# Patient Record
Sex: Female | Born: 1955
Health system: Southern US, Community
[De-identification: ages and names within clinical notes are randomized; demographics above are authoritative.]

---

## 2003-09-07 IMAGING — CT CT ABDOMEN W/O CM
1 series · 15 of 32 positions shown, 19 images · IV contrast (agent unspecified)
Comparison: none

CLINICAL DATA: Left inguinal pain and hematuria. 
 CT ABDOMEN AND PELVIS WITH CONTRAST ([DATE] HOURS)
TECHNIQUE: 5 mm spiral images were obtained through the abdomen and pelvis without contrast. 
 CT ABDOMEN

[Series 2: renal stone · axial · 0.70mm/px · z∈[-439,-74]mm · 15 of 82 slices shown, 19 images]
[im 6/82  soft-tissue]
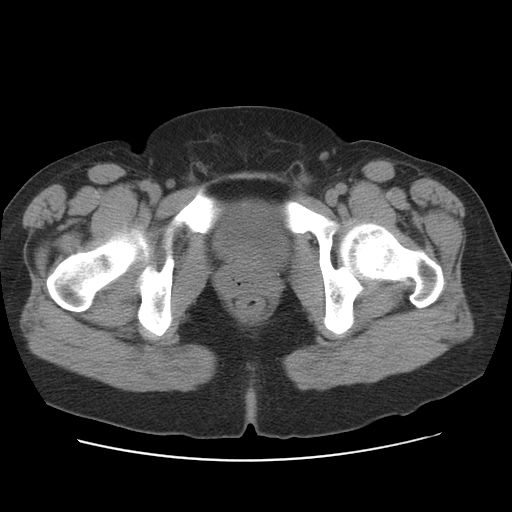
[im 6/82  bone]
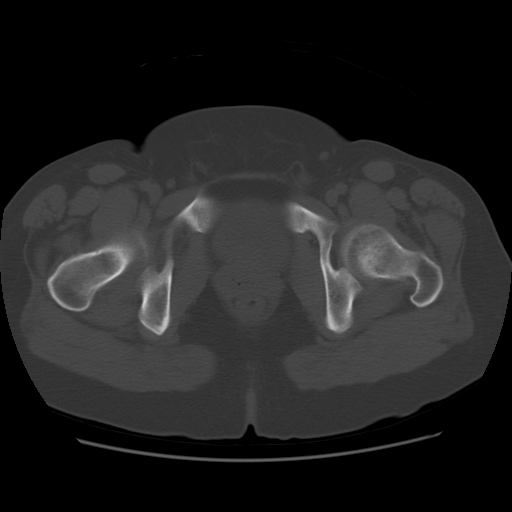
[im 11/82  soft-tissue]
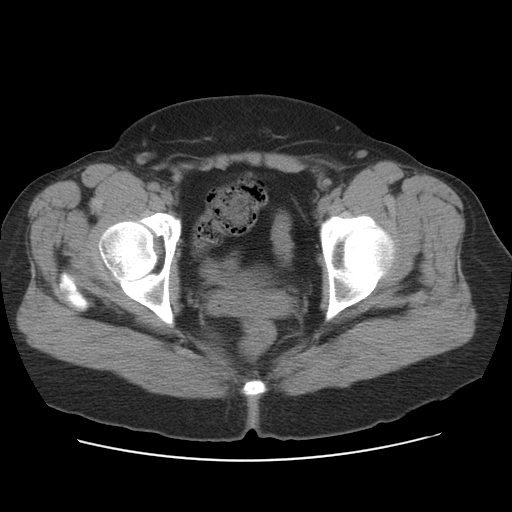
[im 16/82  soft-tissue]
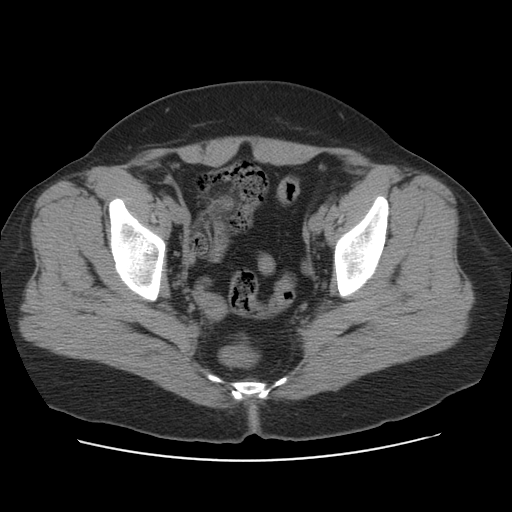
[im 24/82  soft-tissue]
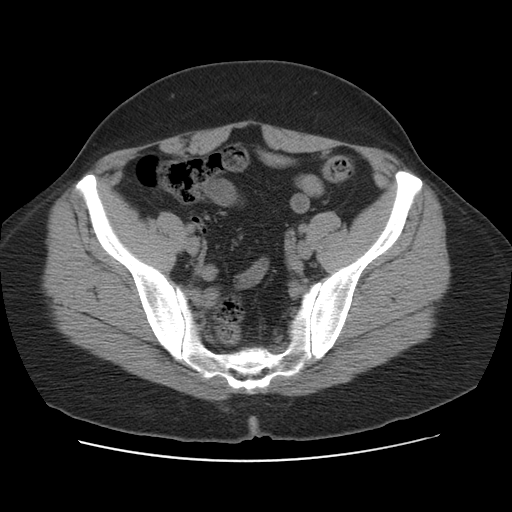
[im 29/82  soft-tissue]
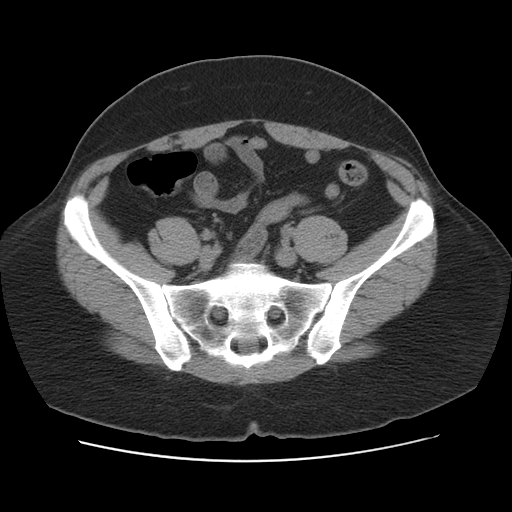
[im 34/82  soft-tissue]
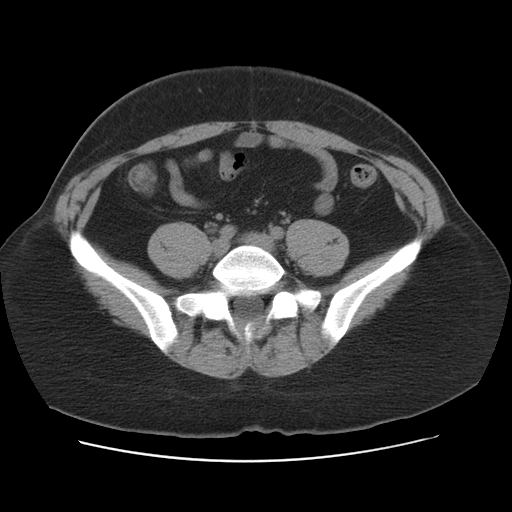
[im 42/82  soft-tissue]
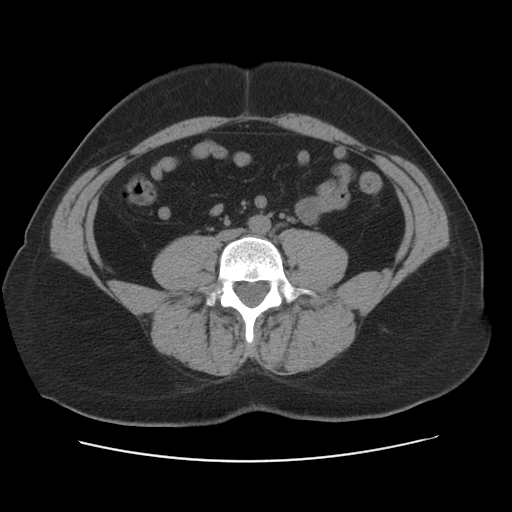
[im 48/82  soft-tissue]
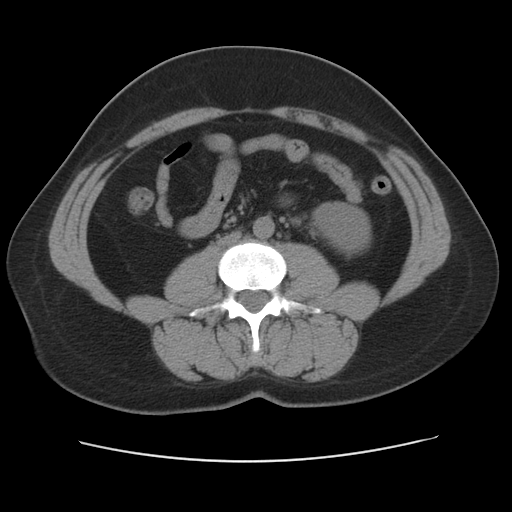
[im 53/82  soft-tissue]
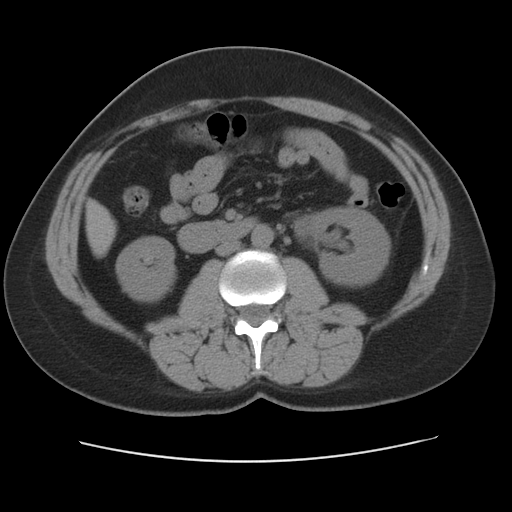
[im 53/82  bone]
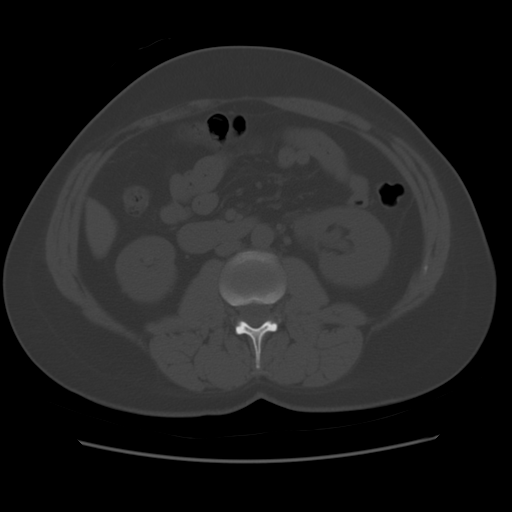
[im 58/82  soft-tissue]
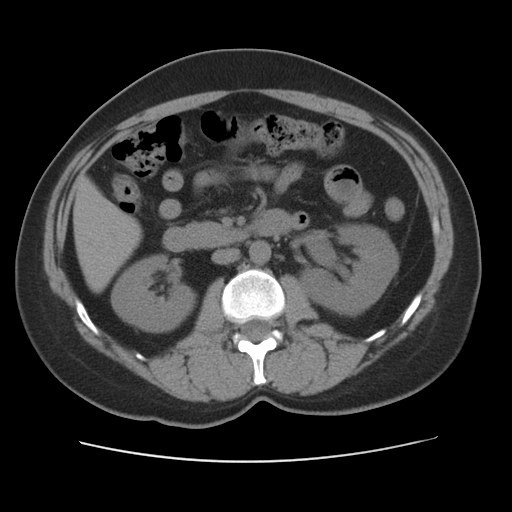
[im 66/82  soft-tissue]
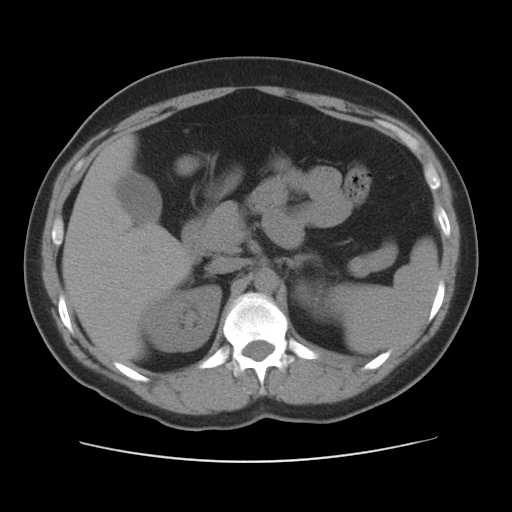
[im 71/82  soft-tissue]
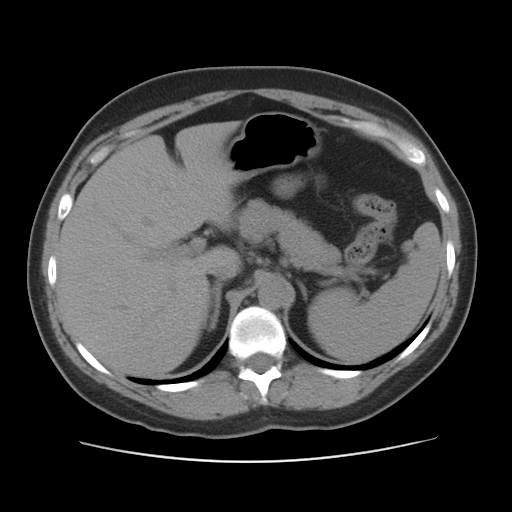
[im 71/82  lung]
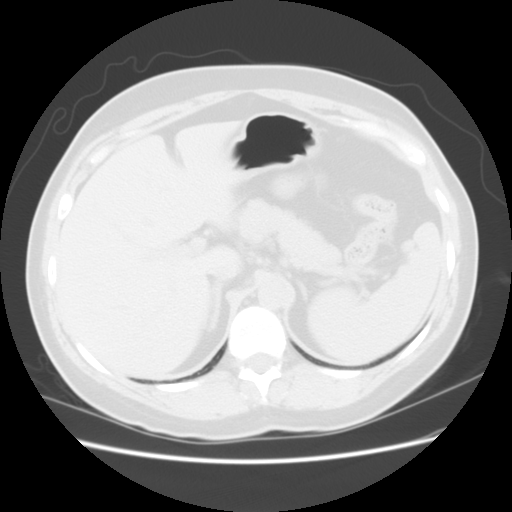
[im 74/82  lung]
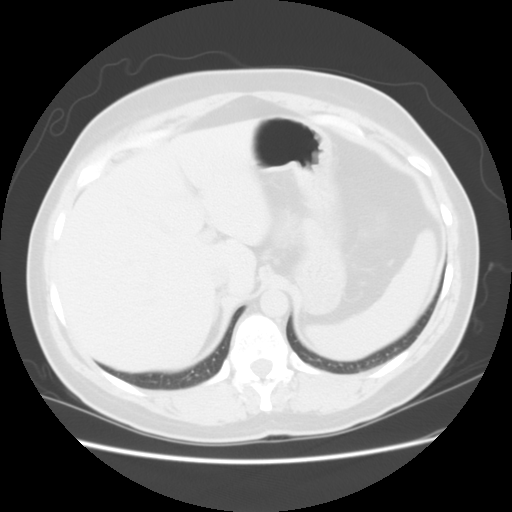
[im 76/82  soft-tissue]
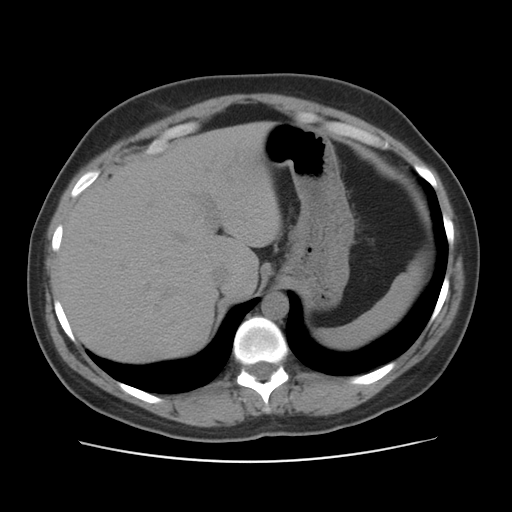
[im 76/82  lung]
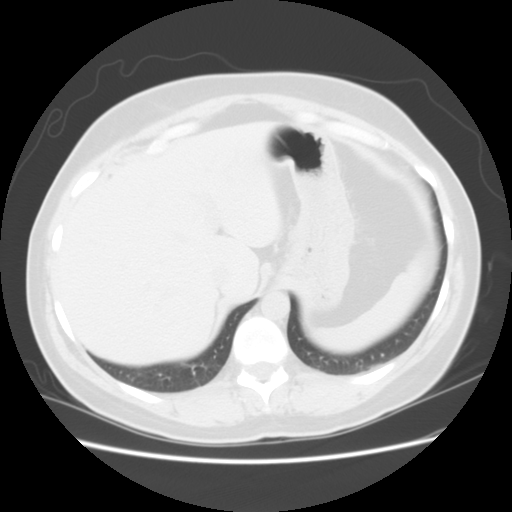
[im 79/82  lung]
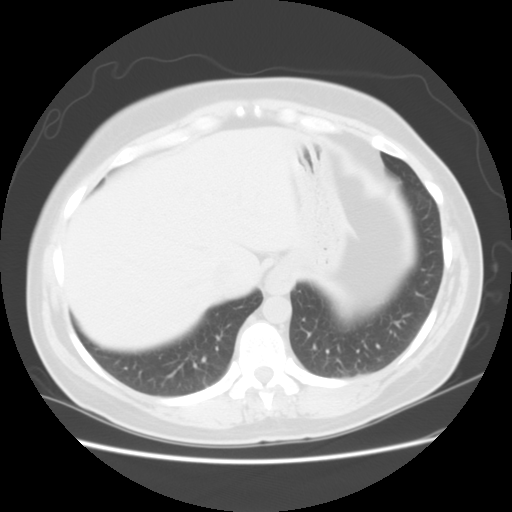

[15 of 32 positions shown; findings below may reference images not displayed]

FINDINGS: Left hydronephrosis and mild left perinephric stranding is noted.  Calculi are seen in the inferior aspect of the left kidney on image 32.  There is also a 6 mm calculus at the left ureteropelvic junction.  Stranding is seen about the proximal ureter.  These findings are compatible with urethral obstruction.  The right kidney is within normal limits.
 The liver, gallbladder, spleen, adrenal glands, and pancreas are within normal limits.  Negative for fluid. 
 IMPRESSION
 Nephrolithiasis.  Left ureteropelvic junction calculus.  Left hydronephrosis and perinephric stranding compatible with urinary obstruction. 
 CT PELVIS 
 There is a small calcification in the left side of the pelvis on image 63.  There is no surrounding soft tissue density and this is felt to be a vascular calcification.  The bladder and right ureter are within normal limits.  The appendix is normal.  Negative free fluid.  
 IMPRESSION
 No evidence of acute intrapelvic pathology.

## 2003-09-12 IMAGING — CR DG ABDOMEN 1V
1 series · 1 of 1 positions shown · non-contrast
Comparison: CT abdomen and pelvis [DATE].

CLINICAL DATA: Left ureteral calculus.  Preop.
 ABDOMEN ? AP VIEW [DATE]

[view not recorded]
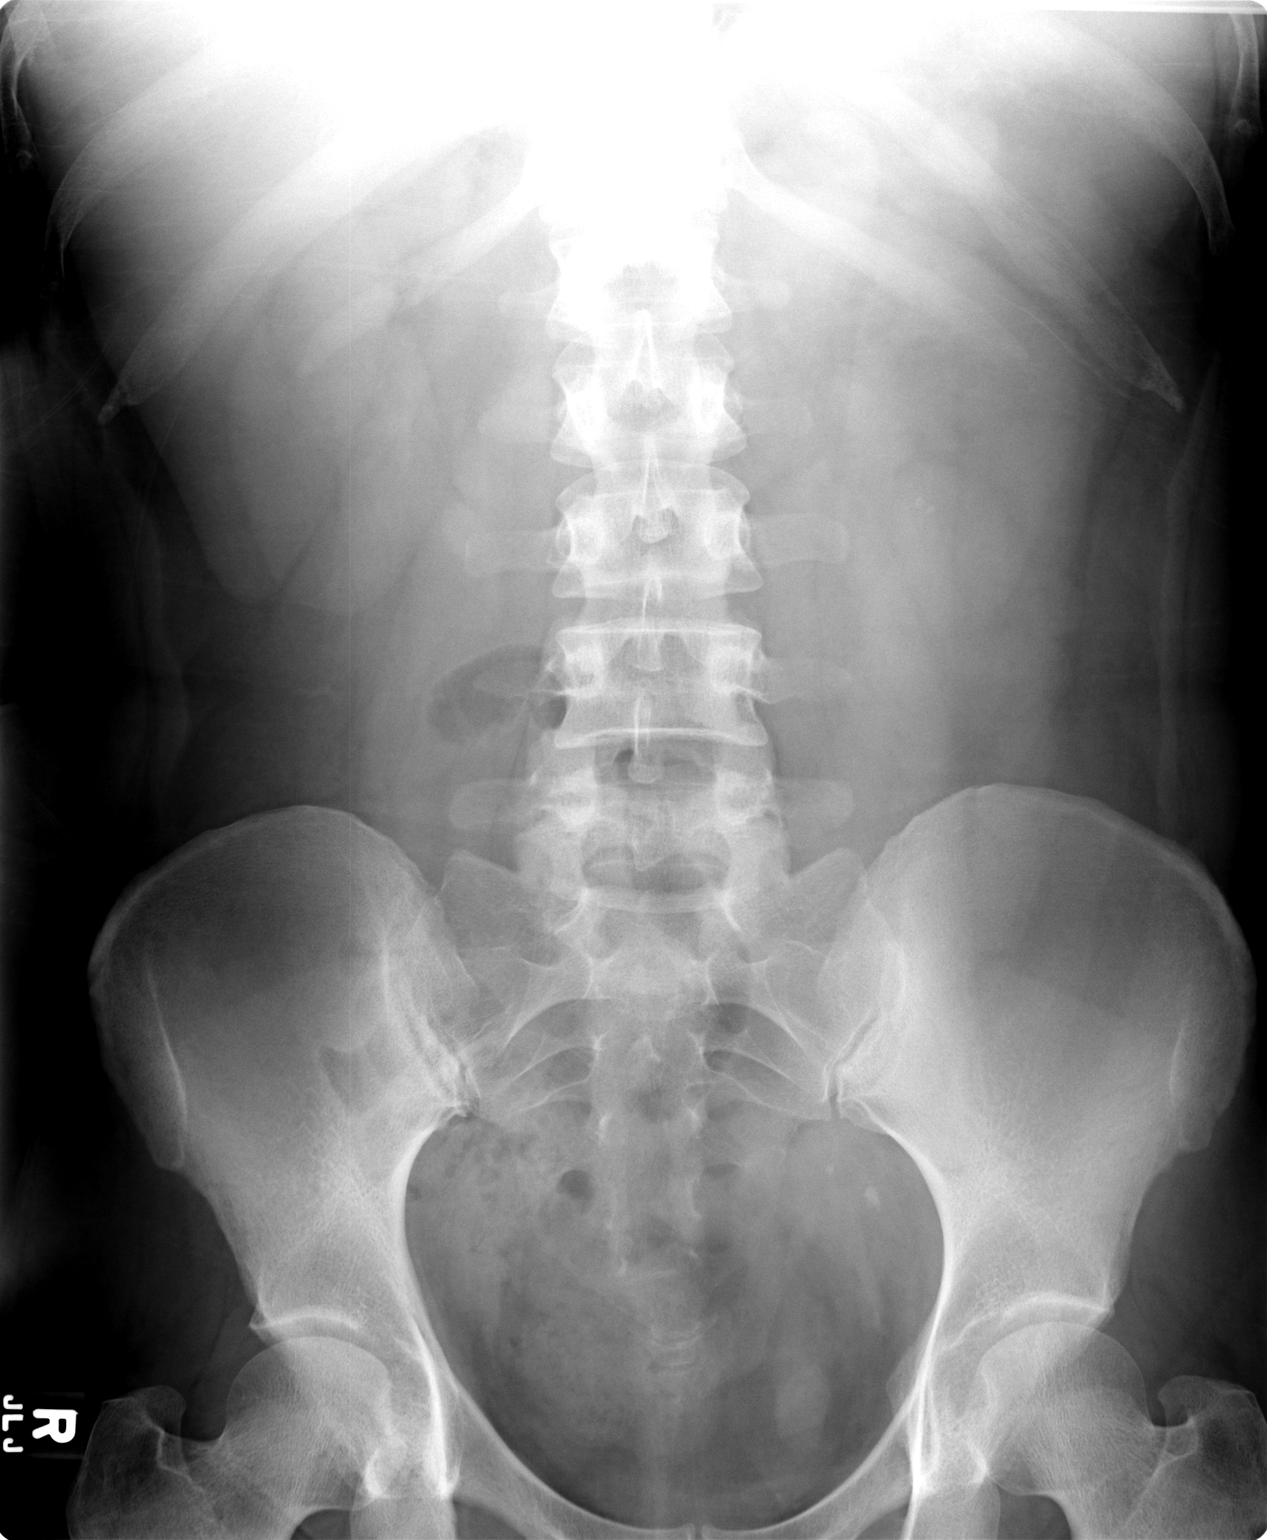

[1 of 1 positions shown; findings below may reference images not displayed]

The two small stones in a lower pole calix of the left kidney are again noted.  The calculus at the left UPJ noted on that examination is not well-visualized.  Vascular calcification in the left side of the pelvis is noted.  There is also a second tiny calcification which is too small to be the ureteral calculus noted on the CT.  The bowel gas pattern is unremarkable.  
 IMPRESSION
 Left lower pole renal calculi.  The left UPJ calculus identified on the prior CT examination is not well seen.

## 2007-12-14 IMAGING — CR DG CHEST 2V
2 series · 2 of 2 positions shown · non-contrast
Comparison: None

CLINICAL DATA: Cough.  Smoker.

CHEST - 2 VIEW

[view not recorded (1 of 2)]
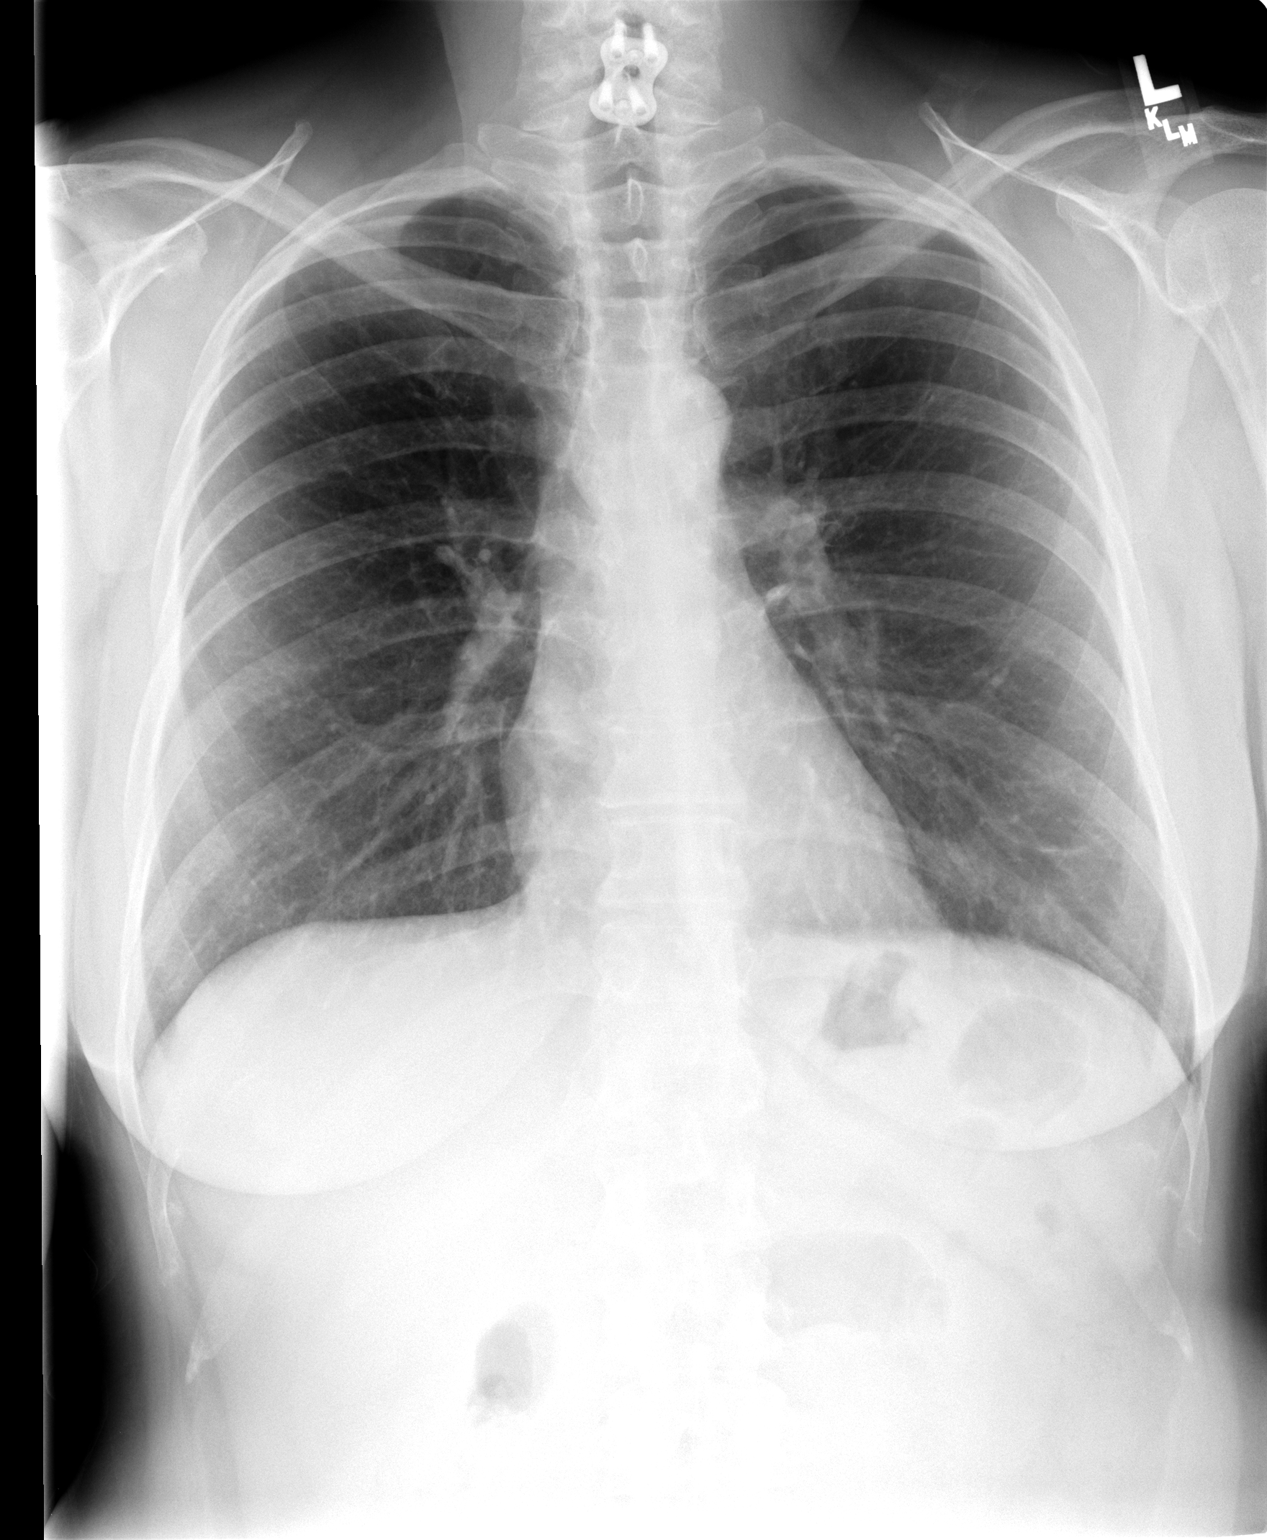

[view not recorded (2 of 2)]
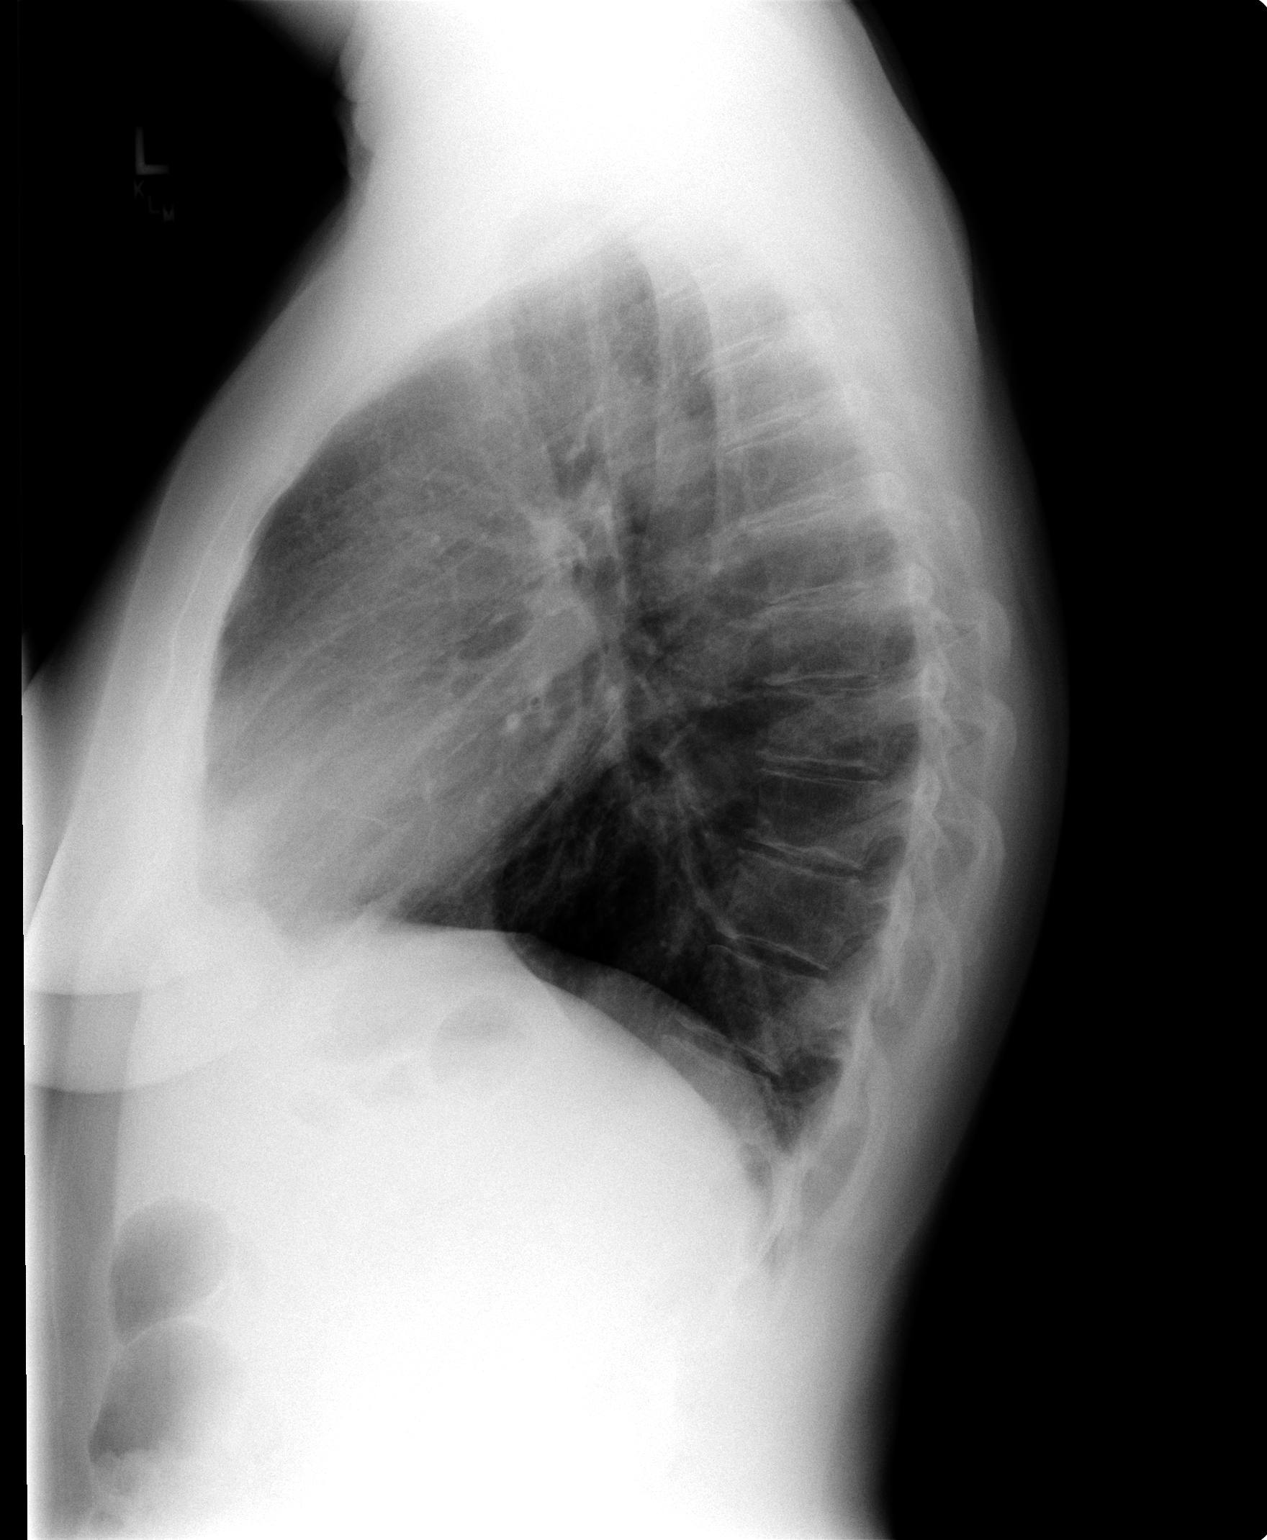

[2 of 2 positions shown; findings below may reference images not displayed]

FINDINGS: Trachea is midline.  Heart size normal.  Scarring is seen
in the lingula.  Lungs otherwise clear.  No pleural fluid.
IMPRESSION: No acute findings.
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
## 2012-11-29 IMAGING — CR DG CHEST 2V
2 series · 2 of 2 positions shown · non-contrast
Comparison: [DATE]

CLINICAL DATA: Shortness of breath.  Tobacco use.  Cough.

CHEST - 2 VIEW

[PA]
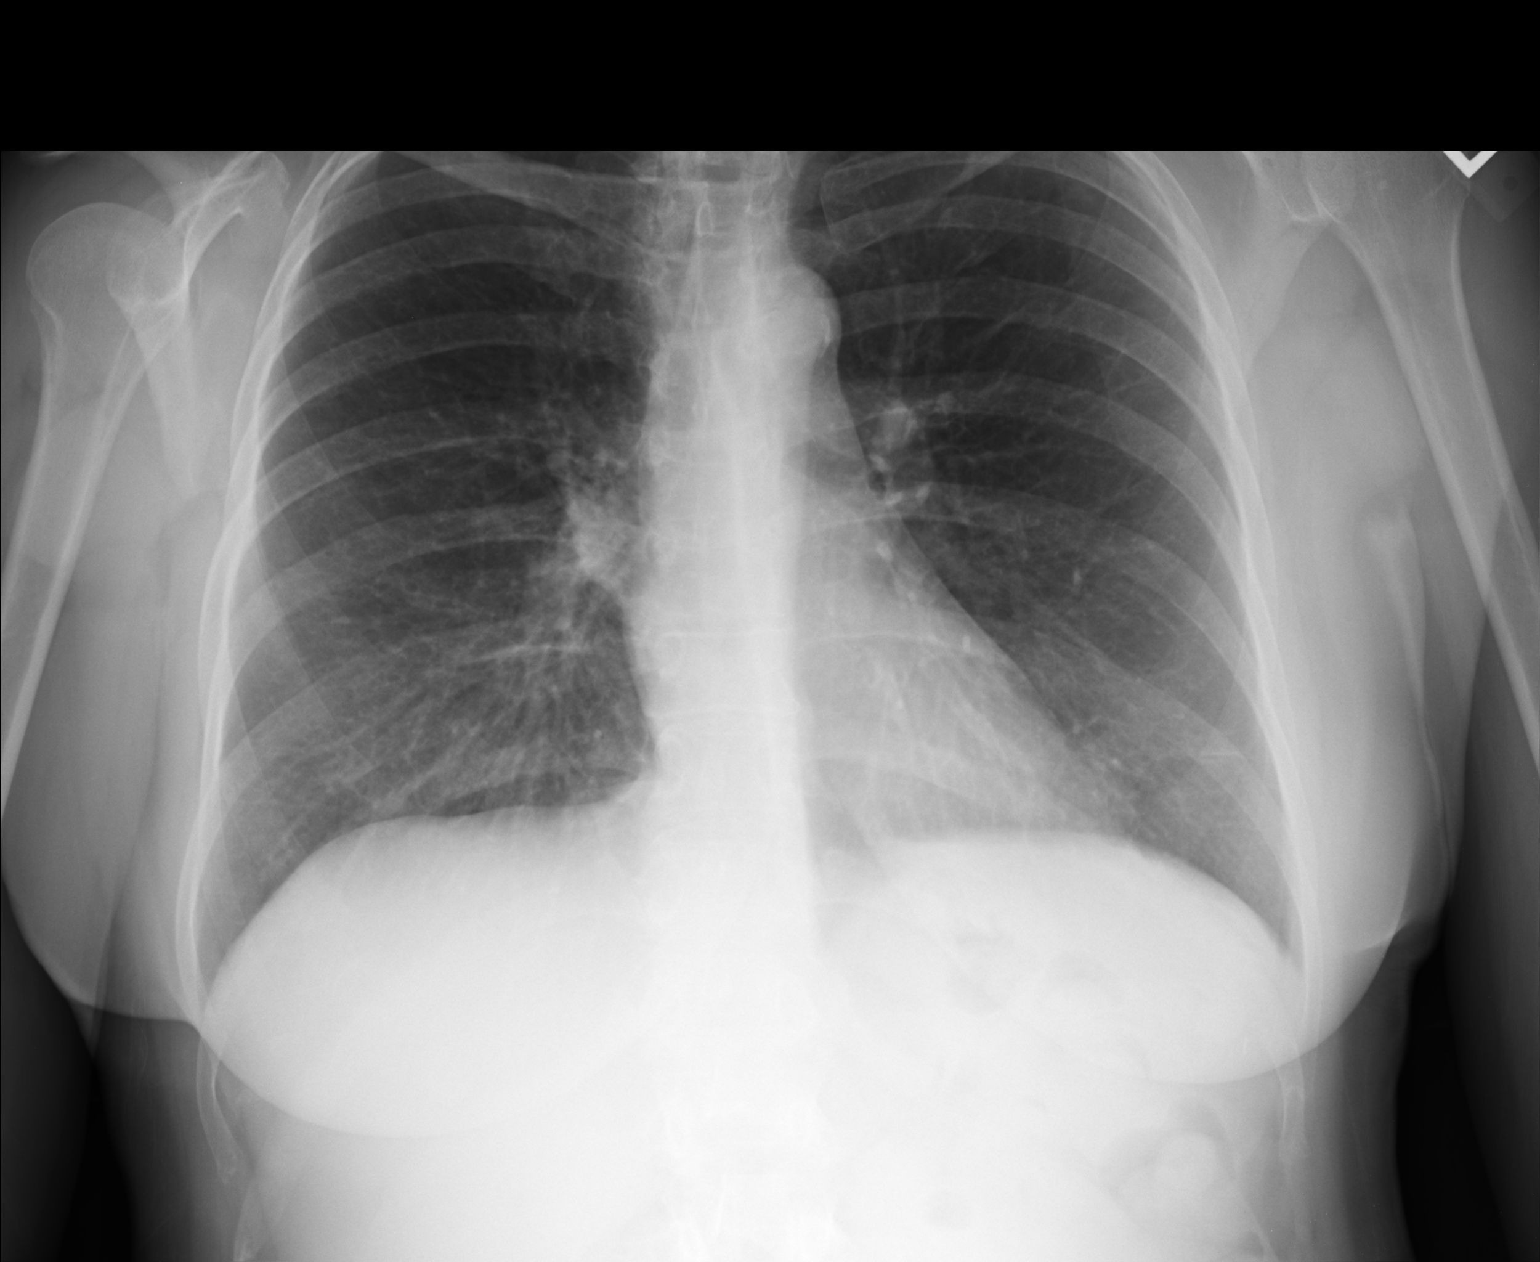

[lateral]
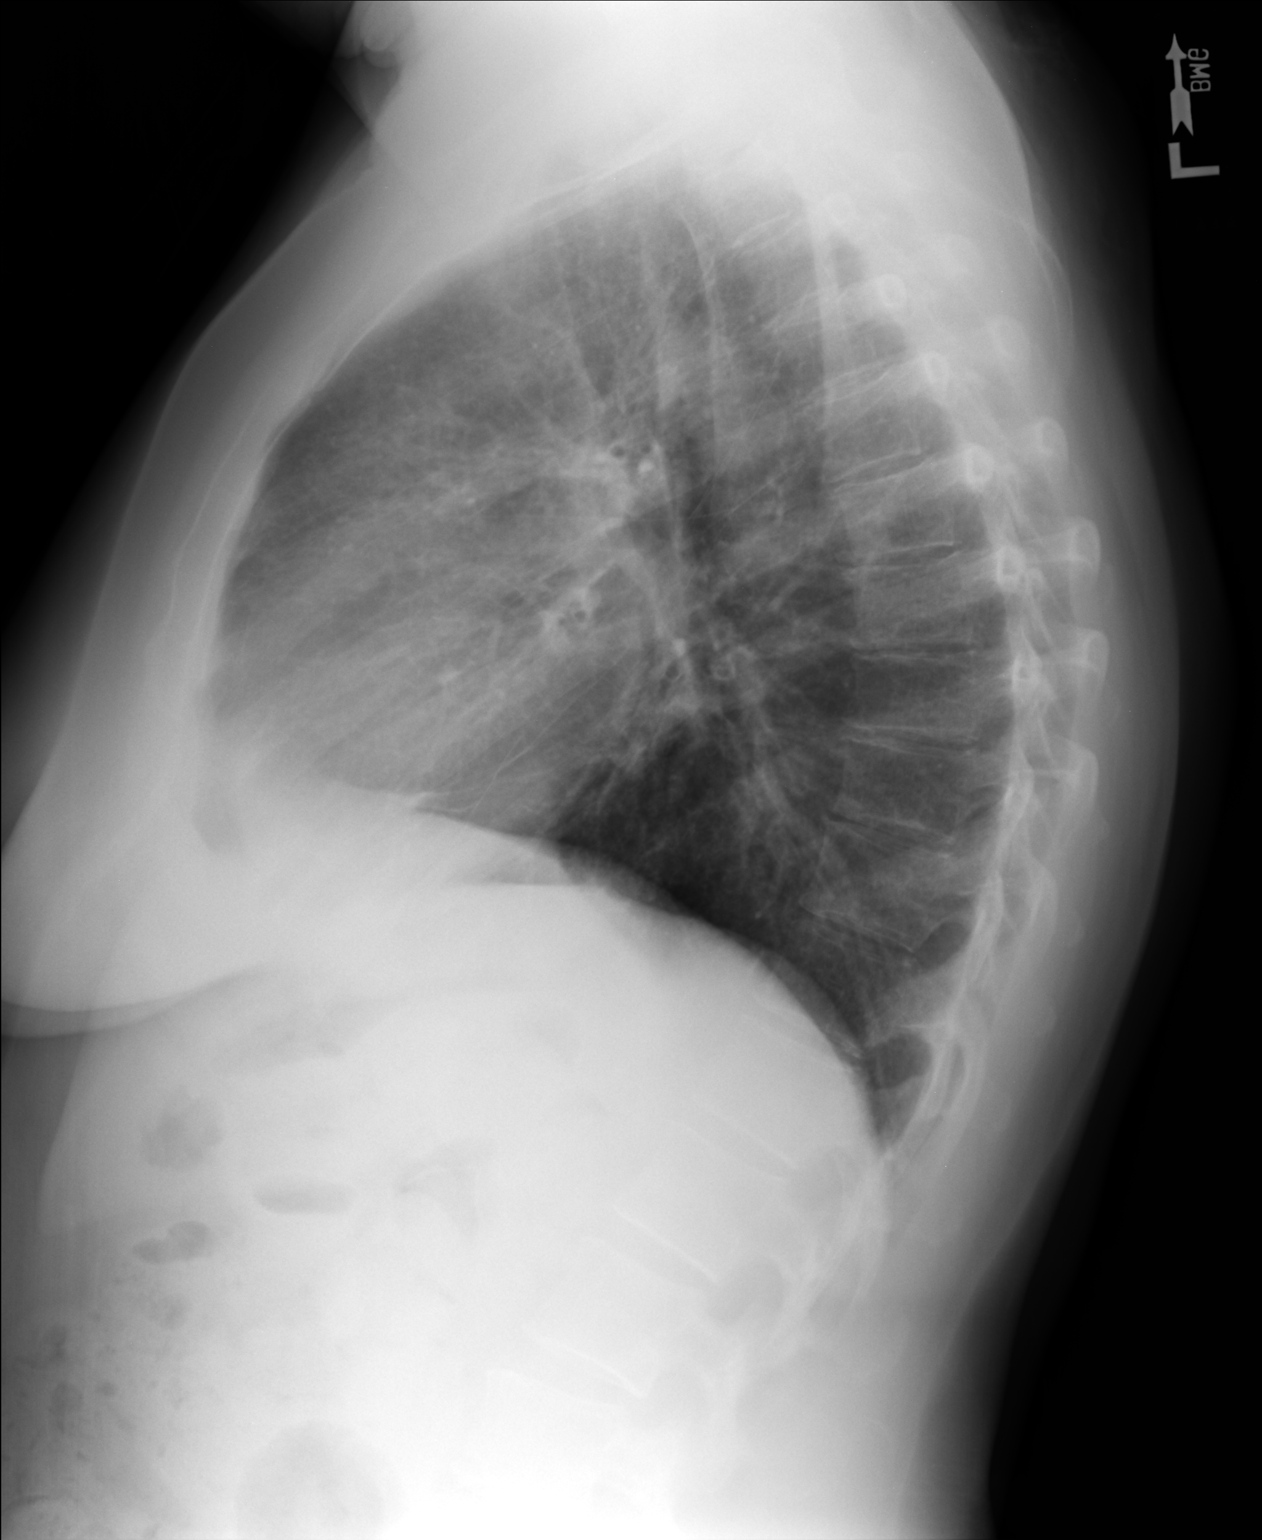

[2 of 2 positions shown; findings below may reference images not displayed]

FINDINGS: Atherosclerotic aortic arch.

Airway thickening may reflect bronchitis or reactive airways
disease.  No airspace opacity is identified to suggest bacterial
pneumonia pattern.  Heart size within normal limits.  No pleural
effusion.
IMPRESSION: 1.  Mild central airway thickening suspicious for bronchitis or
reactive airways disease.  Otherwise negative.

## 2019-07-12 IMAGING — CT CT ANGIO NECK
1 of 9 series · 5 of 33 positions shown · IV contrast (OMNI 350)
Comparison: CT head earlier today.

CLINICAL DATA: Stroke.  Aphasia

EXAM:
CT ANGIOGRAPHY HEAD AND NECK
TECHNIQUE: Multidetector CT imaging of the head and neck was performed using
the standard protocol during bolus administration of intravenous
contrast. Multiplanar CT image reconstructions and MIPs were
obtained to evaluate the vascular anatomy. Carotid stenosis
measurements (when applicable) are obtained utilizing NASCET
criteria, using the distal internal carotid diameter as the
denominator.
CONTRAST:  75mL OMNIPAQUE IOHEXOL 350 MG/ML SOLN

[Series 4: cta neck thins · axial · 0.49mm/px · z∈[-258,-18]mm · 5 of 899 slices shown]
[im 150/899  soft-tissue]
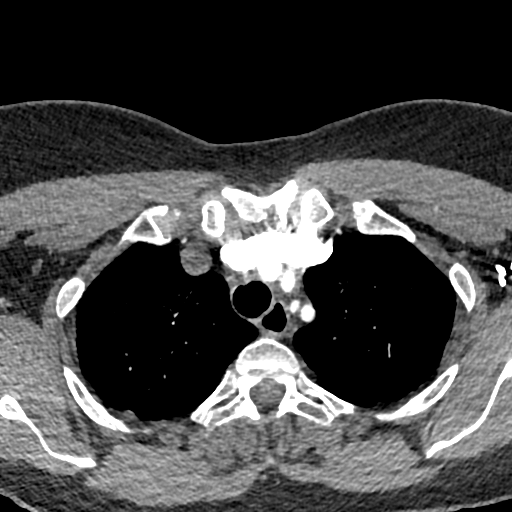
[im 300/899  bone]
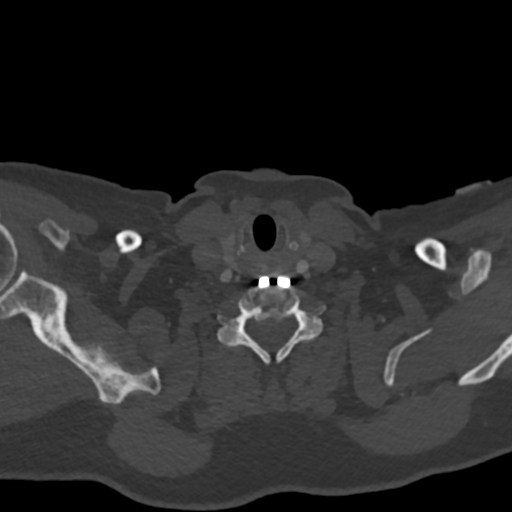
[im 450/899  soft-tissue]
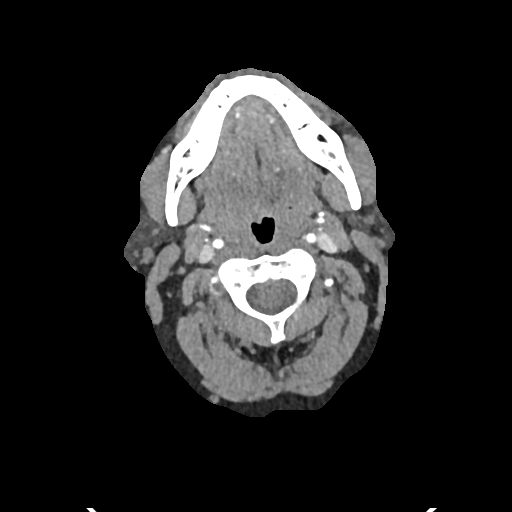
[im 599/899  bone]
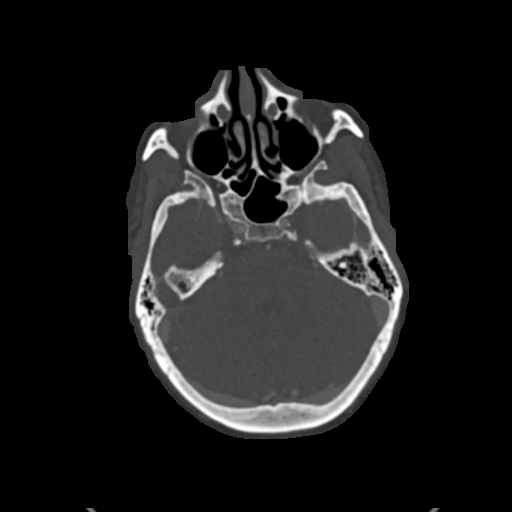
[im 749/899  soft-tissue]
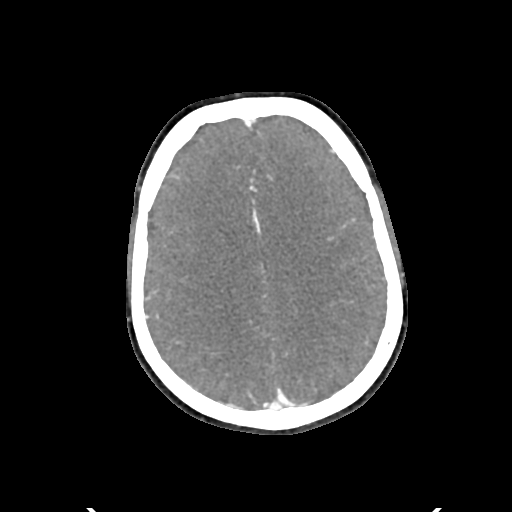

[5 of 33 positions shown; findings below may reference images not displayed]

FINDINGS: CTA NECK FINDINGS

Aortic arch: Mild atherosclerotic disease aortic arch. 4 vessel arch
with the left vertebral artery origin from the arch.

Right carotid system: Atherosclerotic calcification right carotid
bifurcation. 65% diameter stenosis proximal right internal carotid
artery. Right external carotid artery mildly stenotic at the origin.
Remainder of the right internal carotid artery widely patent to the
skull base

Left carotid system: Mild atherosclerotic calcification left carotid
bifurcation without significant stenosis.

Vertebral arteries: Left vertebral artery origin from the arch. Both
vertebral arteries patent to the basilar. Right vertebral artery
widely patent. Moderate stenosis distal left vertebral artery at the
skull base due to calcific stenosis.

Skeleton: ACDF C5-6 with corpectomy.  No acute skeletal abnormality.

Other neck: Negative for mass or adenopathy in the neck.

Upper chest: Lung apices clear bilaterally.

Review of the MIP images confirms the above findings

CTA HEAD FINDINGS

Anterior circulation: Mild atherosclerotic calcification in the
cavernous carotid bilaterally without significant stenosis. Anterior
and middle cerebral arteries patent without significant stenosis.
Mild stenosis right M1.

Posterior circulation: Moderate stenosis left vertebral artery at
the skull base due to calcific disease. Right vertebral widely
patent to the basilar. Small right PICA. Left PICA appears supplied
from the AICA which is patent bilaterally. Mild stenosis in the mid
basilar. Superior cerebellar arteries patent bilaterally. Fetal
origin of the posterior cerebral artery. Mild atherosclerotic
disease bilaterally.

Venous sinuses: Normal venous enhancement.

Anatomic variants: None

Review of the MIP images confirms the above findings
IMPRESSION: 1. Negative for emergent large vessel occlusion.
2. Mild atherosclerotic disease in the right M1 segment and in both
posterior cerebral arteries. Mild atherosclerotic disease in the
basilar.
3. 65% diameter stenosis proximal right internal carotid artery.
Left internal carotid artery widely patent
4. Moderate calcific stenosis  distal left vertebral artery.
5. These results were called by telephone at the time of
interpretation on [DATE] at [DATE] to provider DARTANJAN, who
verbally acknowledged these results.

## 2019-07-12 IMAGING — CT CT HEAD CODE STROKE
3 series · 15 of 47 positions shown, 18 images · non-contrast
Comparison: CT head [DATE]

CLINICAL DATA: Code stroke. Aphasia with right arm weakness. Last
seen normal [Y5] hours

EXAM:
CT HEAD WITHOUT CONTRAST
TECHNIQUE: Contiguous axial images were obtained from the base of the skull
through the vertex without intravenous contrast.

[Series 3: head 5.0 st · axial · 0.44mm/px · z∈[-86,+44]mm · 9 of 32 slices shown, 12 images]
[im 3/32  brain]
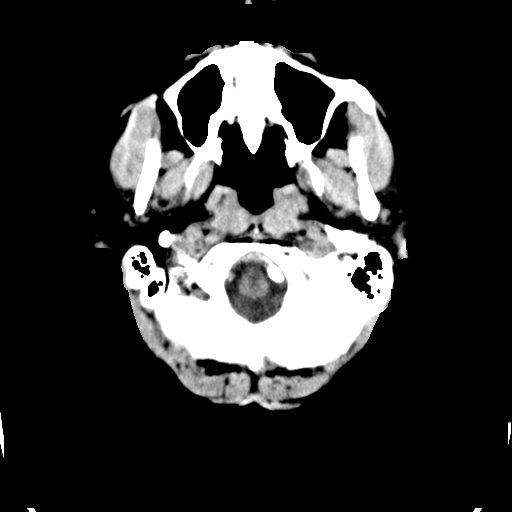
[im 3/32  bone]
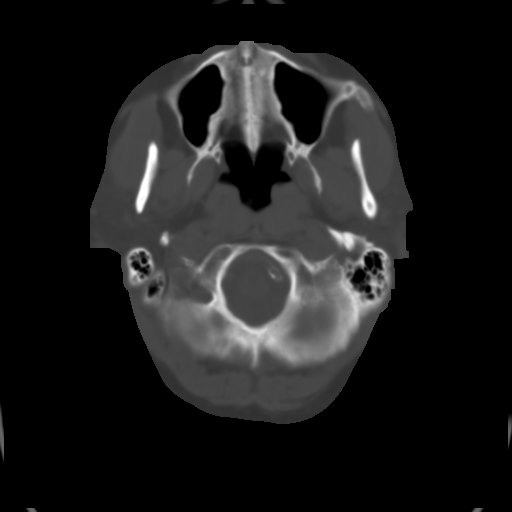
[im 6/32  brain]
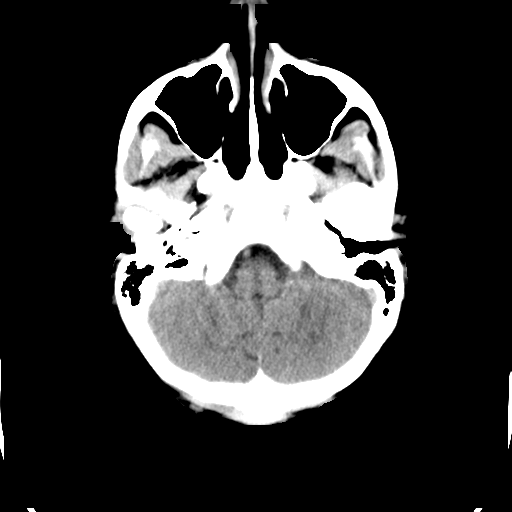
[im 9/32  brain]
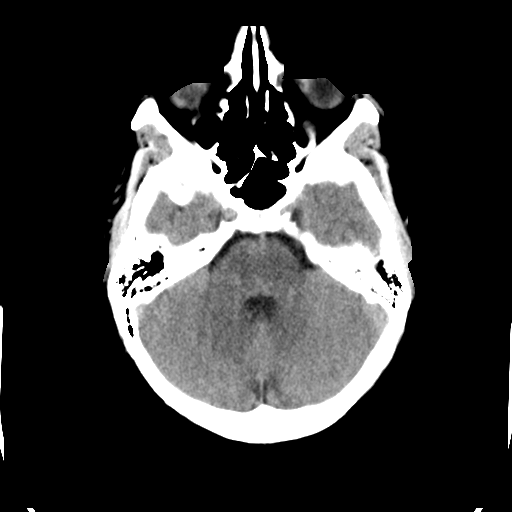
[im 12/32  brain]
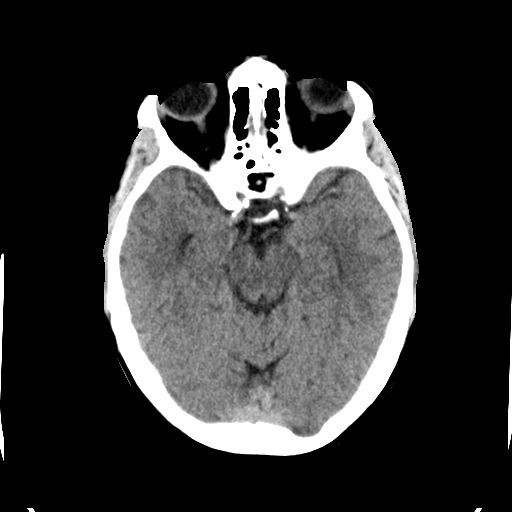
[im 17/32  brain]
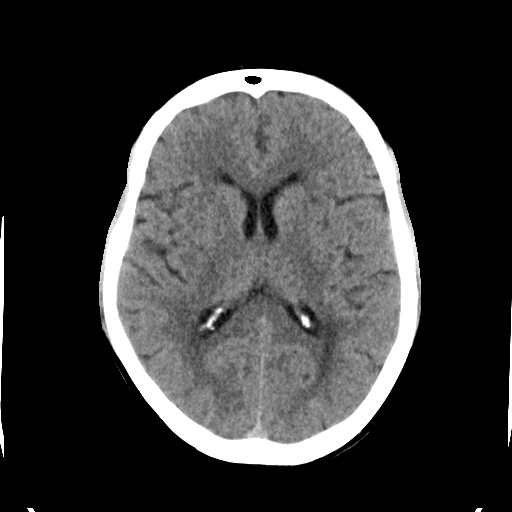
[im 17/32  bone]
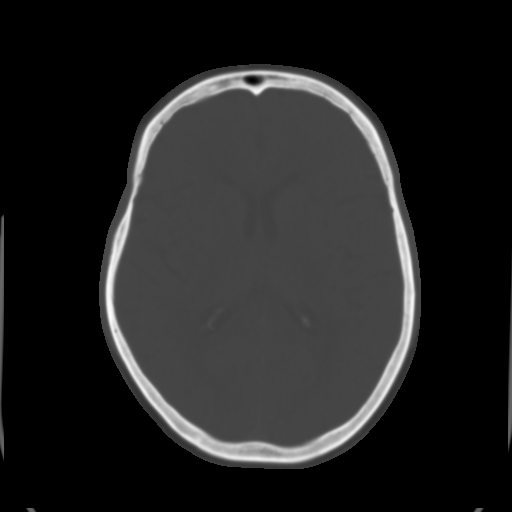
[im 20/32  brain]
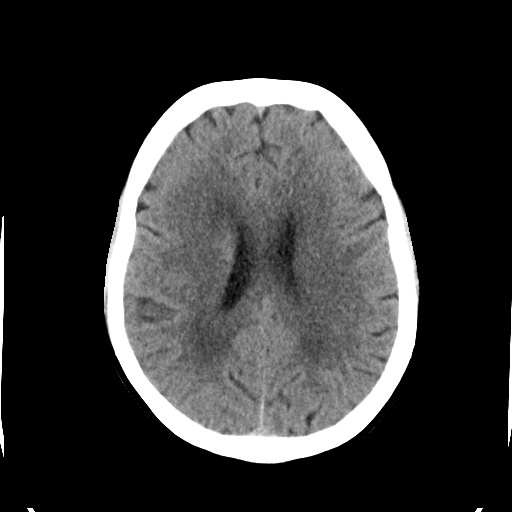
[im 23/32  brain]
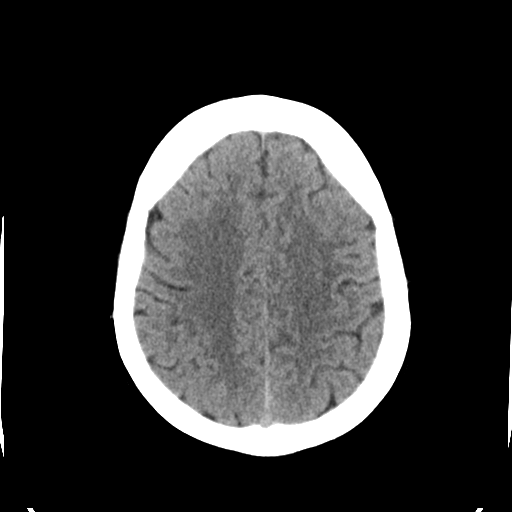
[im 26/32  brain]
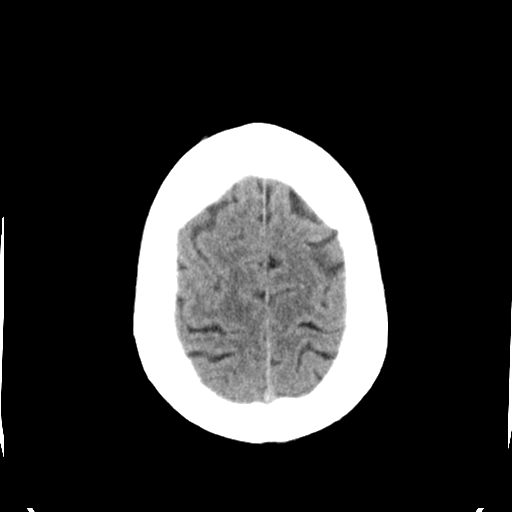
[im 29/32  brain]
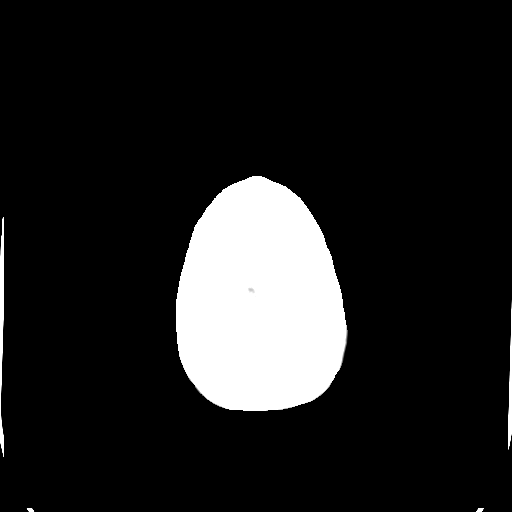
[im 29/32  bone]
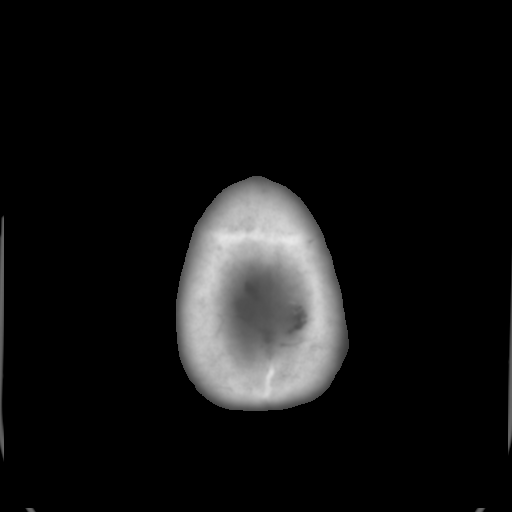

[Series 5: head 3.0 cor st · coronal · 0.31mm/px · 3 of 67 slices shown]
[im 23/67  brain]
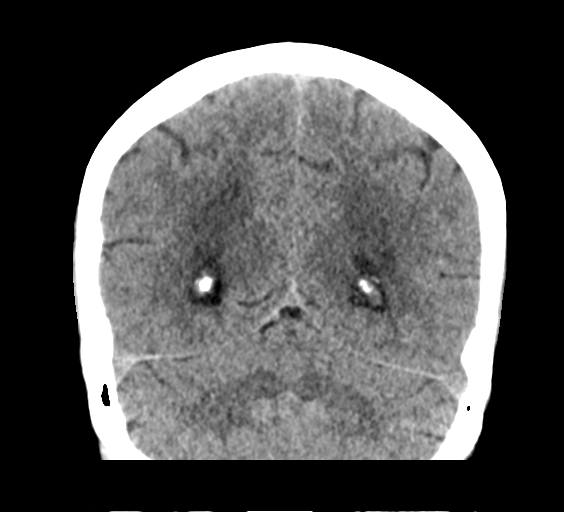
[im 30/67  brain]
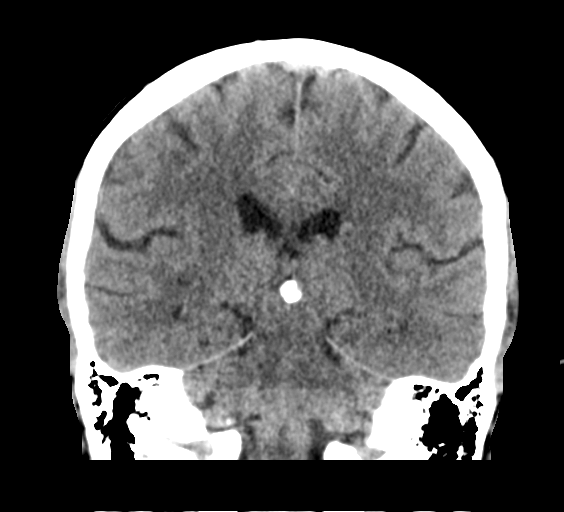
[im 37/67  brain]
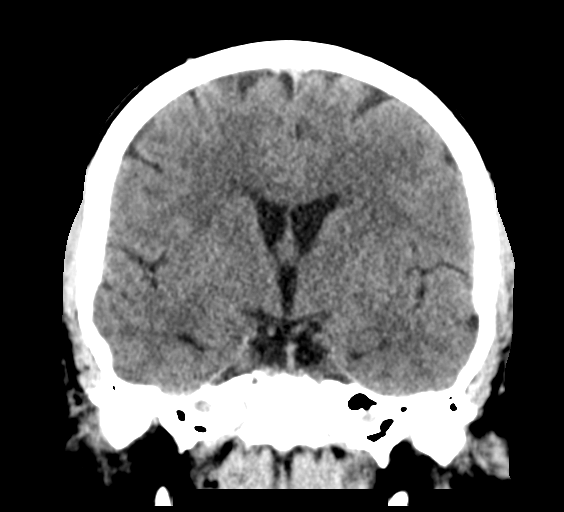

[Series 6: head 3.0 sag st · sagittal · 0.31mm/px · 3 of 61 slices shown]
[im 21/61  brain]
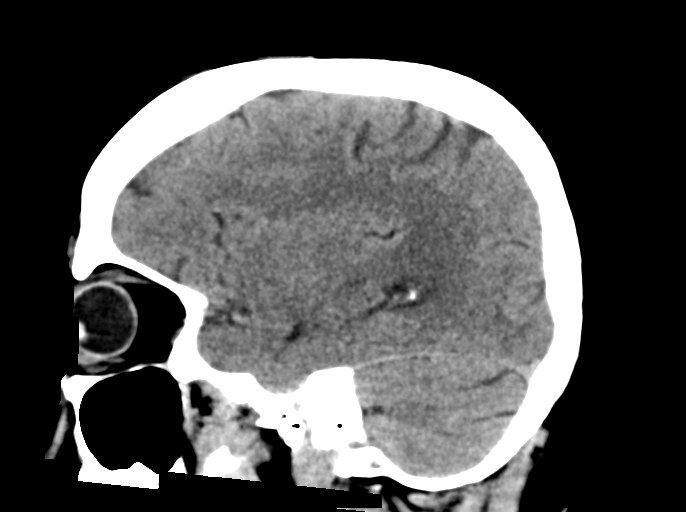
[im 31/61  brain]
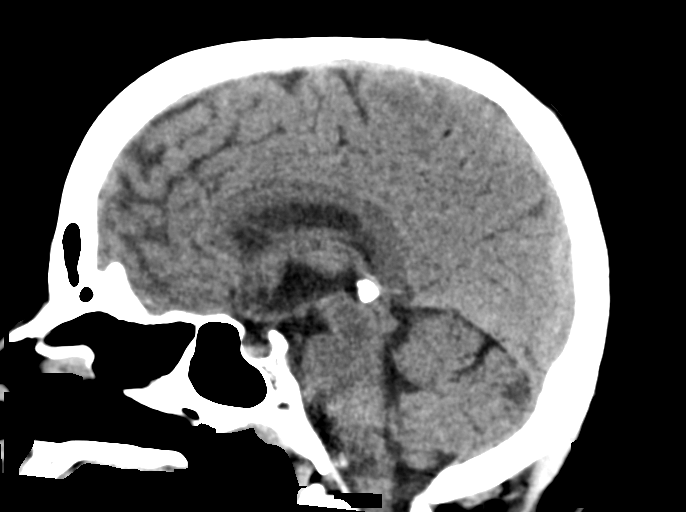
[im 41/61  brain]
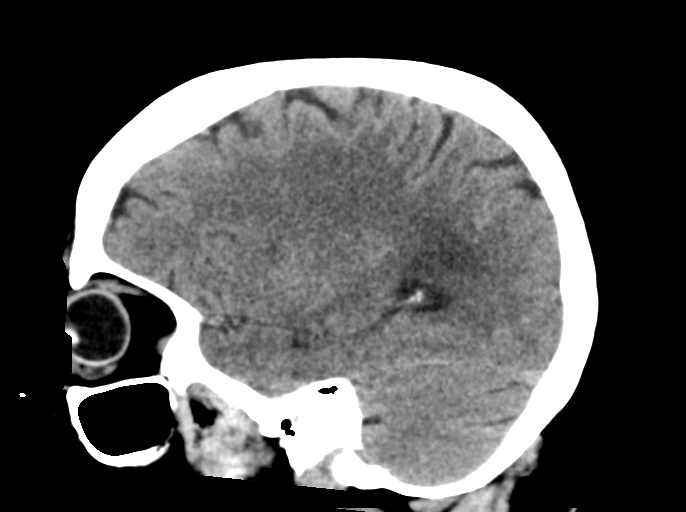

[15 of 47 positions shown; findings below may reference images not displayed]

FINDINGS: Brain: Ventricle size normal. Negative for acute hemorrhage. Patchy
white matter hypodensity bilaterally is relatively symmetric and
appears chronic although is new since [Y5].

Negative for acute infarct, hemorrhage, mass.

Vascular: Atherosclerotic calcification in the carotid and vertebral
arteries. Negative for hyperdense vessel

Skull: Negative

Sinuses/Orbits: Negative

Other: None

ASPECTS (Alberta Stroke Program Early CT Score)

- Ganglionic level infarction (caudate, lentiform nuclei, internal
capsule, insula, M1-M3 cortex): 7

- Supraganglionic infarction (M4-M6 cortex): 3

Total score (0-10 with 10 being normal): 10
IMPRESSION: 1. No acute intracranial abnormality
2. ASPECTS is 10
3. Patchy white matter hypodensity bilaterally most consistent with
chronic microvascular ischemia.
4. These results were called by telephone at the time of
interpretation on [DATE] at [DATE] to provider GARLENS, who
verbally acknowledged these results.

## 2019-07-12 IMAGING — CT CT ANGIO NECK
1 series · 1 of 1 positions shown · IV contrast (omnipaque)
Comparison: CT head earlier today.

CLINICAL DATA: Stroke.  Aphasia

EXAM:
CT ANGIOGRAPHY HEAD AND NECK
TECHNIQUE: Multidetector CT imaging of the head and neck was performed using
the standard protocol during bolus administration of intravenous
contrast. Multiplanar CT image reconstructions and MIPs were
obtained to evaluate the vascular anatomy. Carotid stenosis
measurements (when applicable) are obtained utilizing NASCET
criteria, using the distal internal carotid diameter as the
denominator.
CONTRAST:  75mL OMNIPAQUE IOHEXOL 350 MG/ML SOLN

[Series 2: topogram 0.6 t20f · coronal · 1.00mm/px · 1 of 1 slices shown]
[im 1/1]
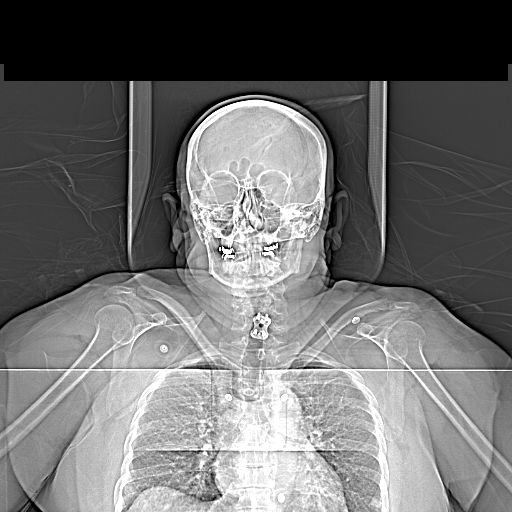

[1 of 1 positions shown; findings below may reference images not displayed]

FINDINGS: CTA NECK FINDINGS

Aortic arch: Mild atherosclerotic disease aortic arch. 4 vessel arch
with the left vertebral artery origin from the arch.

Right carotid system: Atherosclerotic calcification right carotid
bifurcation. 65% diameter stenosis proximal right internal carotid
artery. Right external carotid artery mildly stenotic at the origin.
Remainder of the right internal carotid artery widely patent to the
skull base

Left carotid system: Mild atherosclerotic calcification left carotid
bifurcation without significant stenosis.

Vertebral arteries: Left vertebral artery origin from the arch. Both
vertebral arteries patent to the basilar. Right vertebral artery
widely patent. Moderate stenosis distal left vertebral artery at the
skull base due to calcific stenosis.

Skeleton: ACDF C5-6 with corpectomy.  No acute skeletal abnormality.

Other neck: Negative for mass or adenopathy in the neck.

Upper chest: Lung apices clear bilaterally.

Review of the MIP images confirms the above findings

CTA HEAD FINDINGS

Anterior circulation: Mild atherosclerotic calcification in the
cavernous carotid bilaterally without significant stenosis. Anterior
and middle cerebral arteries patent without significant stenosis.
Mild stenosis right M1.

Posterior circulation: Moderate stenosis left vertebral artery at
the skull base due to calcific disease. Right vertebral widely
patent to the basilar. Small right PICA. Left PICA appears supplied
from the AICA which is patent bilaterally. Mild stenosis in the mid
basilar. Superior cerebellar arteries patent bilaterally. Fetal
origin of the posterior cerebral artery. Mild atherosclerotic
disease bilaterally.

Venous sinuses: Normal venous enhancement.

Anatomic variants: None

Review of the MIP images confirms the above findings
IMPRESSION: 1. Negative for emergent large vessel occlusion.
2. Mild atherosclerotic disease in the right M1 segment and in both
posterior cerebral arteries. Mild atherosclerotic disease in the
basilar.
3. 65% diameter stenosis proximal right internal carotid artery.
Left internal carotid artery widely patent
4. Moderate calcific stenosis  distal left vertebral artery.
5. These results were called by telephone at the time of
interpretation on [DATE] at [DATE] to provider DARTANJAN, who
verbally acknowledged these results.

## 2019-07-12 IMAGING — CT CT HEAD W/O CM
4 series · 17 of 47 positions shown, 19 images · non-contrast
Comparison: CT head from same day.

CLINICAL DATA: Stroke follow-up. Right-sided facial droop and
slurred speech.

EXAM:
CT HEAD WITHOUT CONTRAST
TECHNIQUE: Contiguous axial images were obtained from the base of the skull
through the vertex without intravenous contrast.

[Series 3: head without · axial · non-contrast · 0.43mm/px · z∈[-95,+25]mm · 7 of 34 slices shown, 9 images]
[im 5/34  brain]
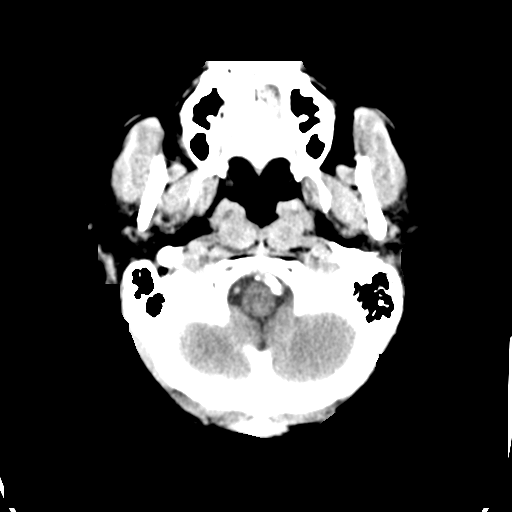
[im 5/34  bone]
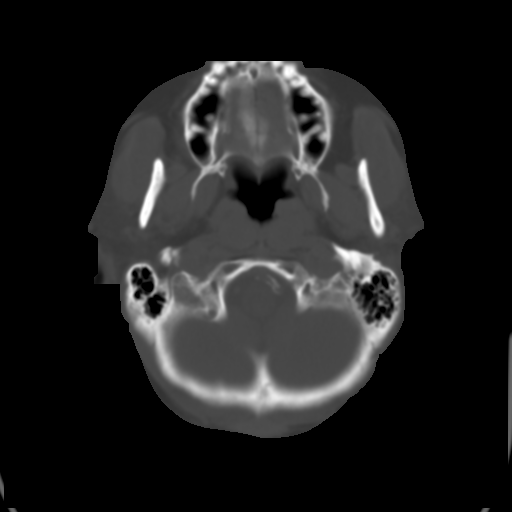
[im 9/34  brain]
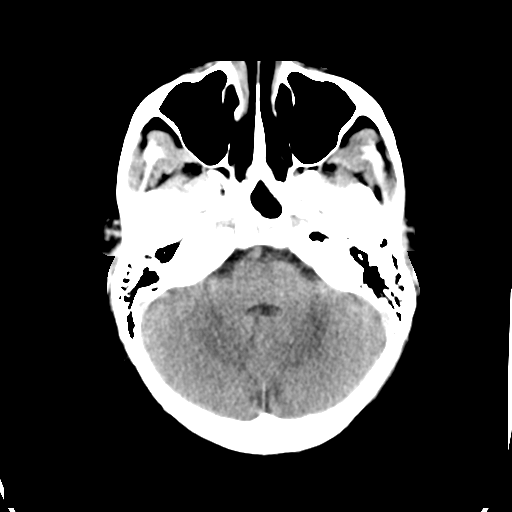
[im 13/34  brain]
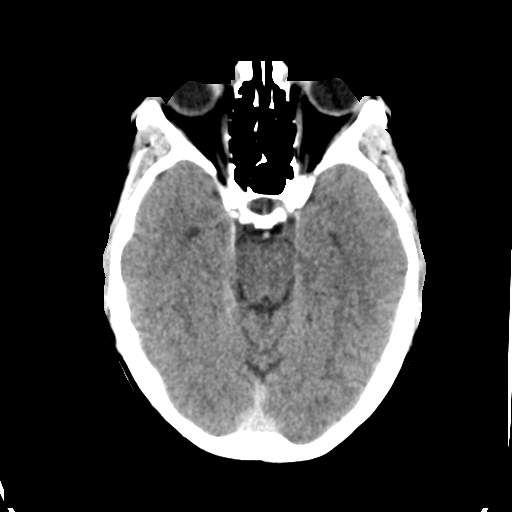
[im 17/34  brain]
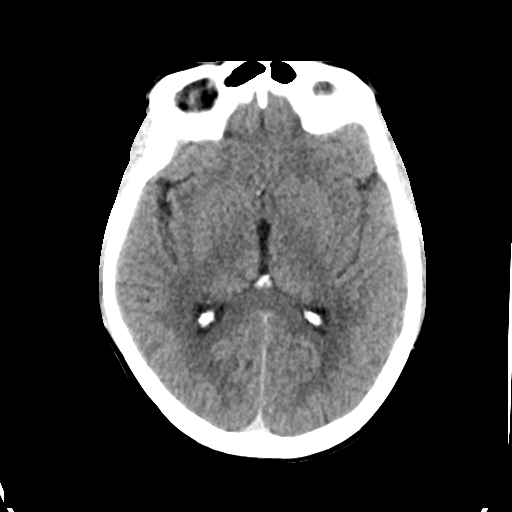
[im 21/34  brain]
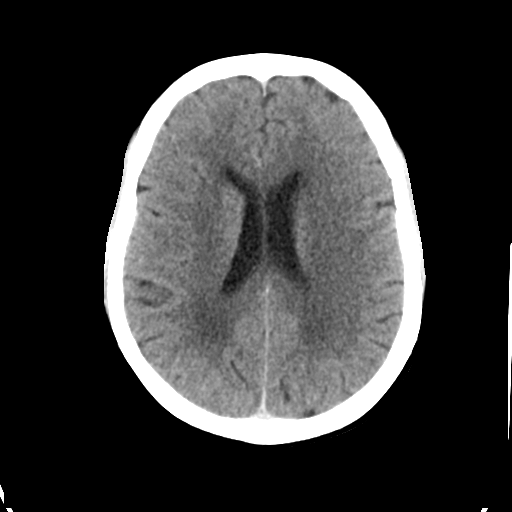
[im 21/34  bone]
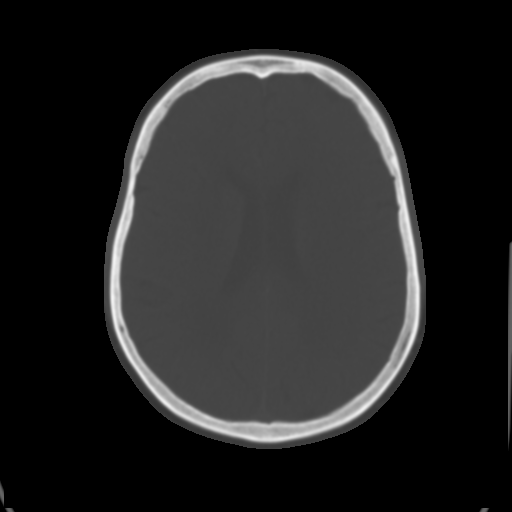
[im 25/34  brain]
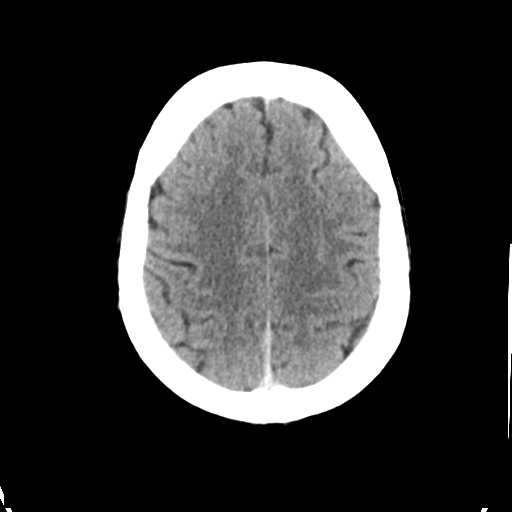
[im 29/34  brain]
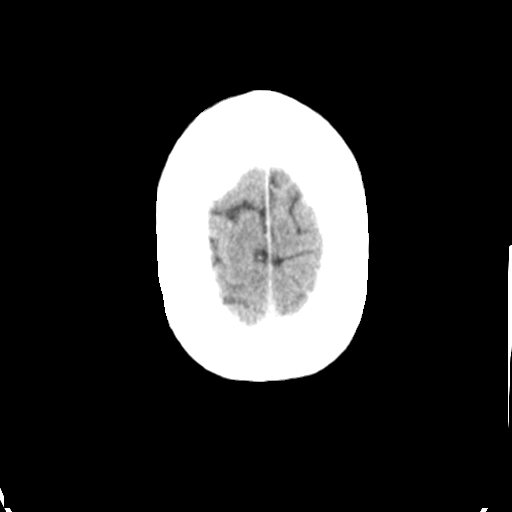

[Series 4: head bone · axial · 0.43mm/px · z∈[-99,-41]mm · 4 of 84 slices shown]
[im 9/84  bone]
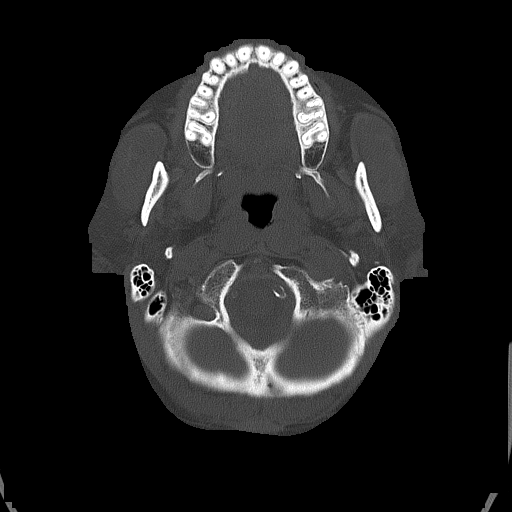
[im 17/84  bone]
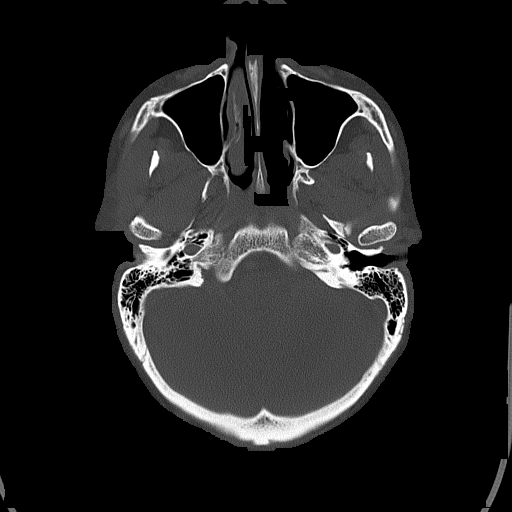
[im 25/84  bone]
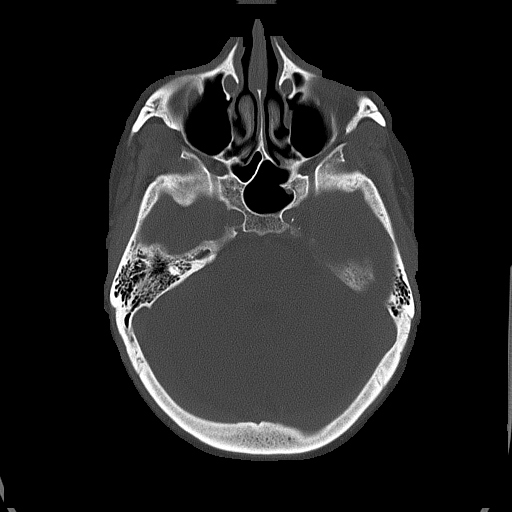
[im 38/84  bone]
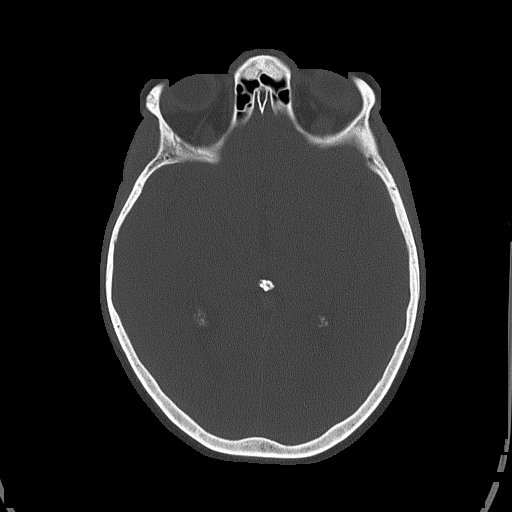

[Series 5: head without cor · coronal · non-contrast · 0.33mm/px · 3 of 69 slices shown]
[im 23/69  brain]
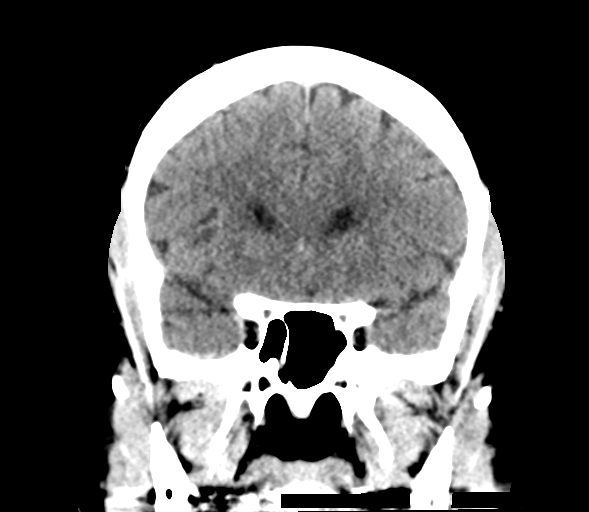
[im 31/69  brain]
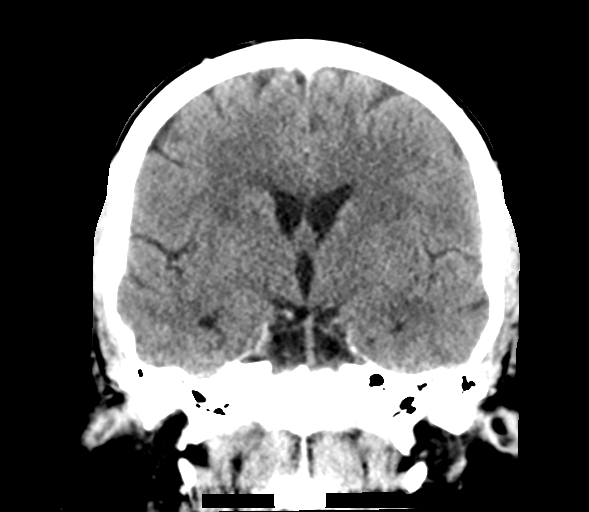
[im 38/69  brain]
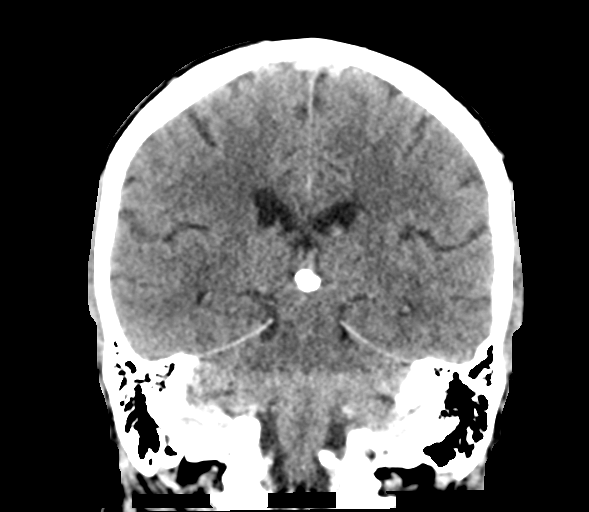

[Series 6: head without sag · sagittal · non-contrast · 0.33mm/px · 3 of 60 slices shown]
[im 20/60  brain]
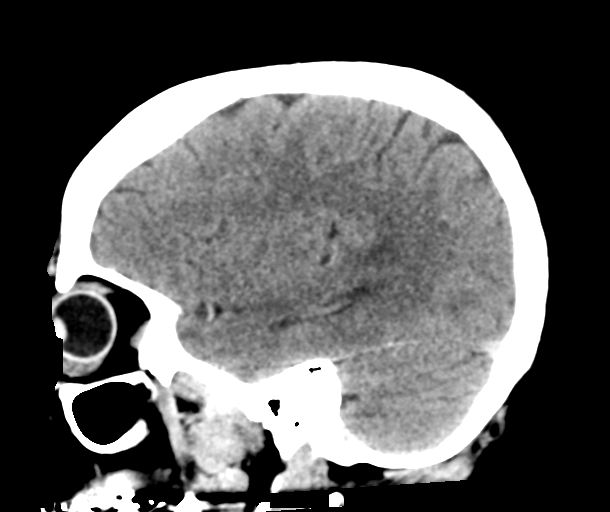
[im 30/60  brain]
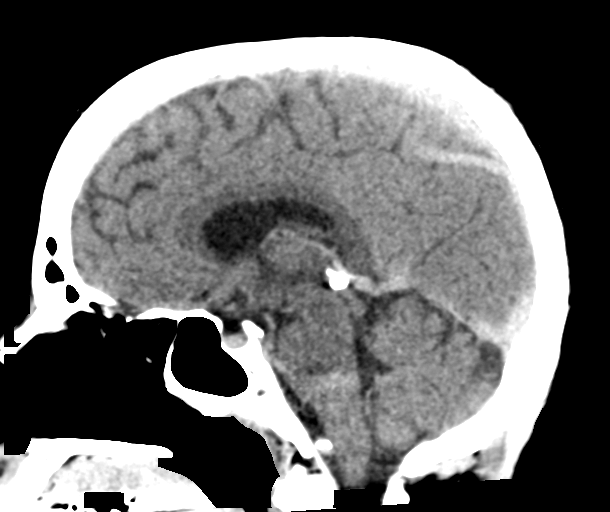
[im 40/60  brain]
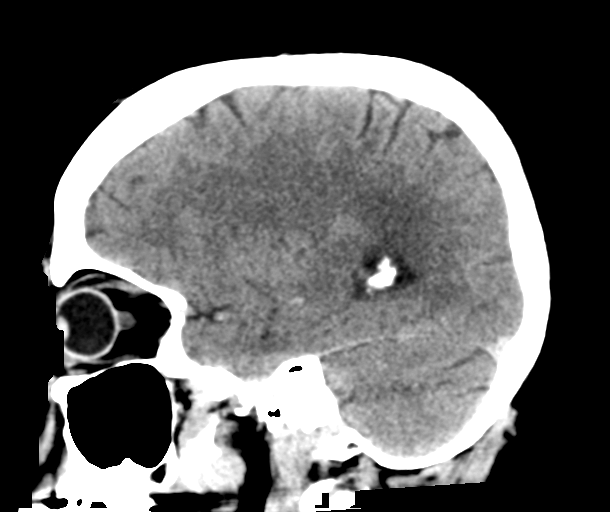

[17 of 47 positions shown; findings below may reference images not displayed]

FINDINGS: Brain: No evidence of acute infarction, hemorrhage, hydrocephalus,
extra-axial collection or mass lesion/mass effect. Mild chronic
microvascular ischemic changes again noted.

Vascular: Calcified atherosclerosis at the skullbase. No hyperdense
vessel.

Skull: Normal. Negative for fracture or focal lesion.

Sinuses/Orbits: No acute finding.

Other: None.
IMPRESSION: No acute intracranial abnormality.

## 2019-07-12 IMAGING — CT CT ANGIO HEAD
2 of 8 series · 6 of 33 positions shown · IV contrast (OMNI 350)
Comparison: CT head earlier today.

CLINICAL DATA: Stroke.  Aphasia

EXAM:
CT ANGIOGRAPHY HEAD AND NECK
TECHNIQUE: Multidetector CT imaging of the head and neck was performed using
the standard protocol during bolus administration of intravenous
contrast. Multiplanar CT image reconstructions and MIPs were
obtained to evaluate the vascular anatomy. Carotid stenosis
measurements (when applicable) are obtained utilizing NASCET
criteria, using the distal internal carotid diameter as the
denominator.
CONTRAST:  75mL OMNIPAQUE IOHEXOL 350 MG/ML SOLN

[Series 1: cta neck · axial · 0.49mm/px · z∈[-199,-79]mm · 2 of 180 slices shown]
[im 60/180  soft-tissue]
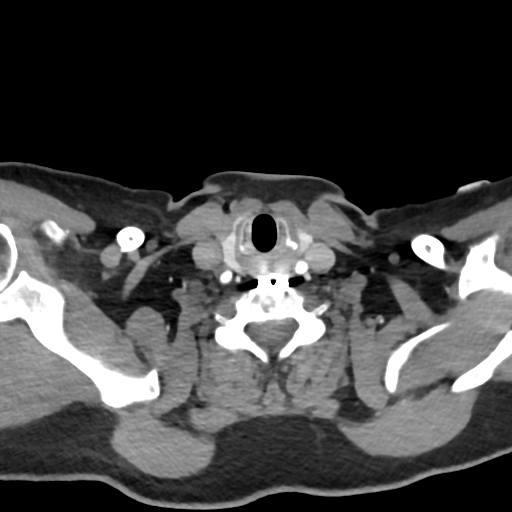
[im 120/180  soft-tissue]
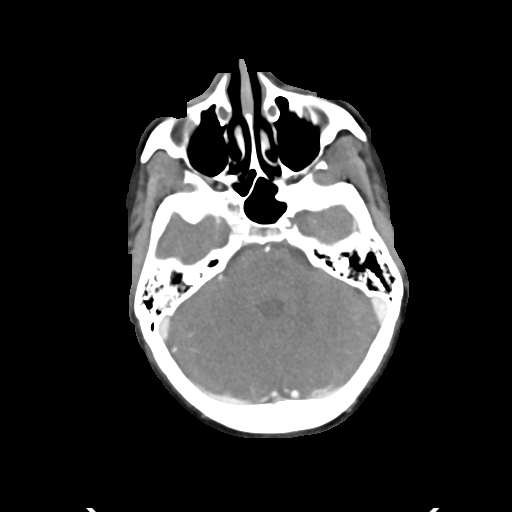

[Series 5: cta neck axial · axial · 0.39mm/px · z∈[-246,-30]mm · 4 of 360 slices shown]
[im 72/360  soft-tissue]
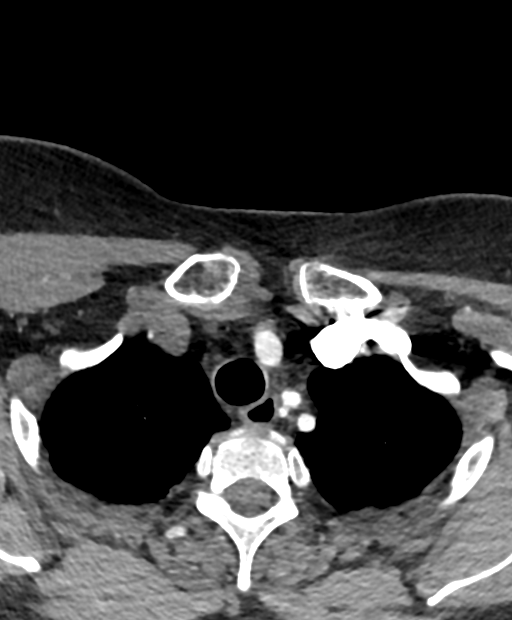
[im 144/360  bone]
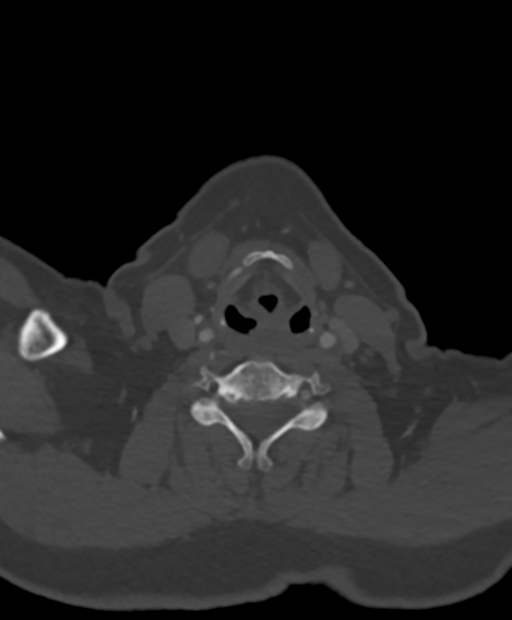
[im 216/360  soft-tissue]
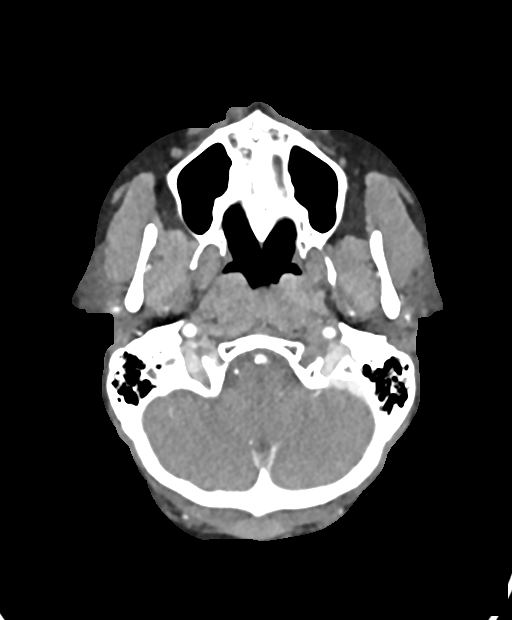
[im 288/360  bone]
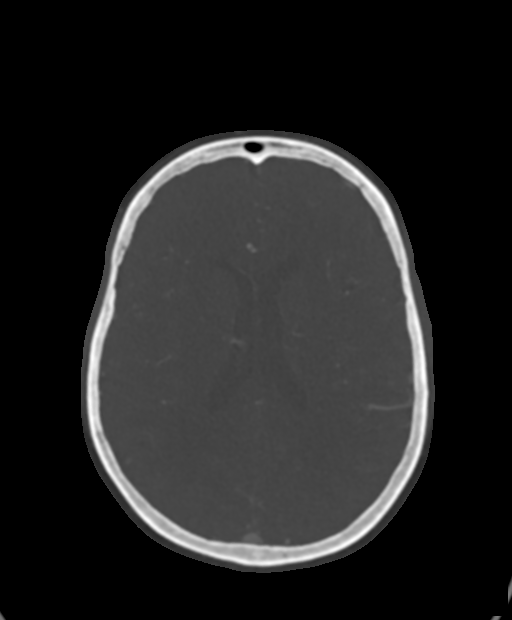

[6 of 33 positions shown; findings below may reference images not displayed]

FINDINGS: CTA NECK FINDINGS

Aortic arch: Mild atherosclerotic disease aortic arch. 4 vessel arch
with the left vertebral artery origin from the arch.

Right carotid system: Atherosclerotic calcification right carotid
bifurcation. 65% diameter stenosis proximal right internal carotid
artery. Right external carotid artery mildly stenotic at the origin.
Remainder of the right internal carotid artery widely patent to the
skull base

Left carotid system: Mild atherosclerotic calcification left carotid
bifurcation without significant stenosis.

Vertebral arteries: Left vertebral artery origin from the arch. Both
vertebral arteries patent to the basilar. Right vertebral artery
widely patent. Moderate stenosis distal left vertebral artery at the
skull base due to calcific stenosis.

Skeleton: ACDF C5-6 with corpectomy.  No acute skeletal abnormality.

Other neck: Negative for mass or adenopathy in the neck.

Upper chest: Lung apices clear bilaterally.

Review of the MIP images confirms the above findings

CTA HEAD FINDINGS

Anterior circulation: Mild atherosclerotic calcification in the
cavernous carotid bilaterally without significant stenosis. Anterior
and middle cerebral arteries patent without significant stenosis.
Mild stenosis right M1.

Posterior circulation: Moderate stenosis left vertebral artery at
the skull base due to calcific disease. Right vertebral widely
patent to the basilar. Small right PICA. Left PICA appears supplied
from the AICA which is patent bilaterally. Mild stenosis in the mid
basilar. Superior cerebellar arteries patent bilaterally. Fetal
origin of the posterior cerebral artery. Mild atherosclerotic
disease bilaterally.

Venous sinuses: Normal venous enhancement.

Anatomic variants: None

Review of the MIP images confirms the above findings
IMPRESSION: 1. Negative for emergent large vessel occlusion.
2. Mild atherosclerotic disease in the right M1 segment and in both
posterior cerebral arteries. Mild atherosclerotic disease in the
basilar.
3. 65% diameter stenosis proximal right internal carotid artery.
Left internal carotid artery widely patent
4. Moderate calcific stenosis  distal left vertebral artery.
5. These results were called by telephone at the time of
interpretation on [DATE] at [DATE] to provider DARTANJAN, who
verbally acknowledged these results.
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
## 2019-09-30 IMAGING — CT CT CTA ABD/PEL W/CM AND/OR W/O CM
2 of 8 series · 16 of 36 positions shown · IV contrast (omnipaque)
Comparison: No priors.

CLINICAL DATA: 63-year-old female with history of severe aortic
stenosis. Preprocedural study prior to potential transcatheter
aortic valve replacement (TAVR) procedure.

EXAM:
CT ANGIOGRAPHY CHEST, ABDOMEN AND PELVIS
TECHNIQUE: Multidetector CT imaging through the chest, abdomen and pelvis was
performed using the standard protocol during bolus administration of
intravenous contrast. Multiplanar reconstructed images and MIPs were
obtained and reviewed to evaluate the vascular anatomy.
CONTRAST:  100mL OMNIPAQUE IOHEXOL 350 MG/ML SOLN

[Series 6: ax thins · axial · 0.59mm/px · z∈[-500,+104]mm · 15 of 686 slices shown]
[im 41/686  lung]
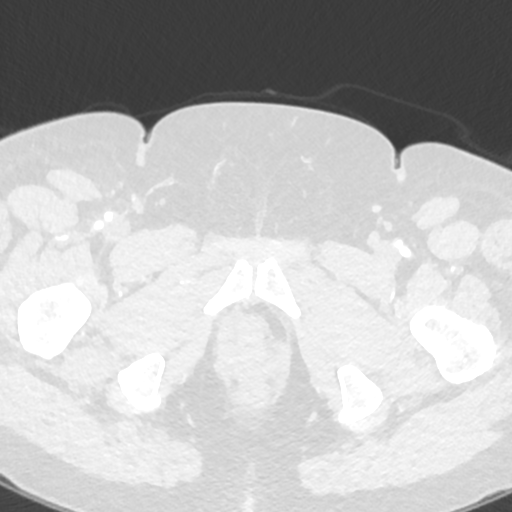
[im 81/686  mediastinal]
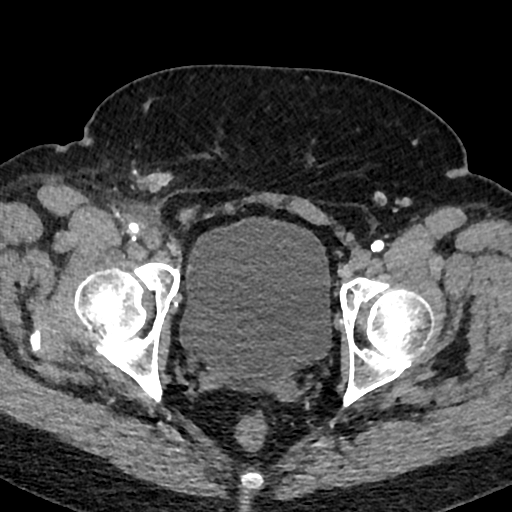
[im 121/686  lung]
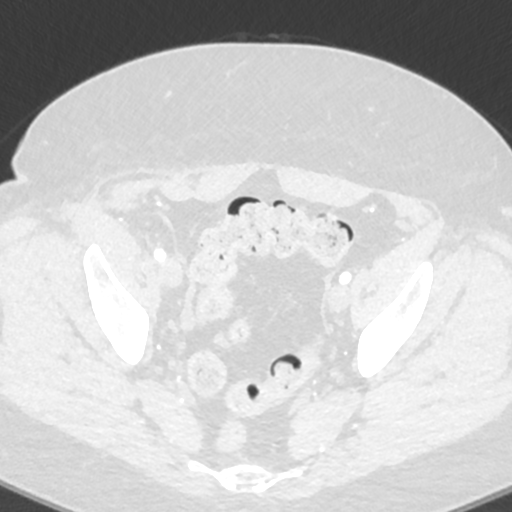
[im 162/686  mediastinal]
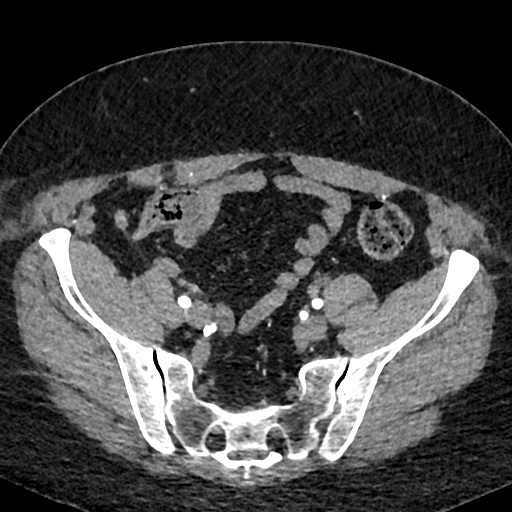
[im 202/686  lung]
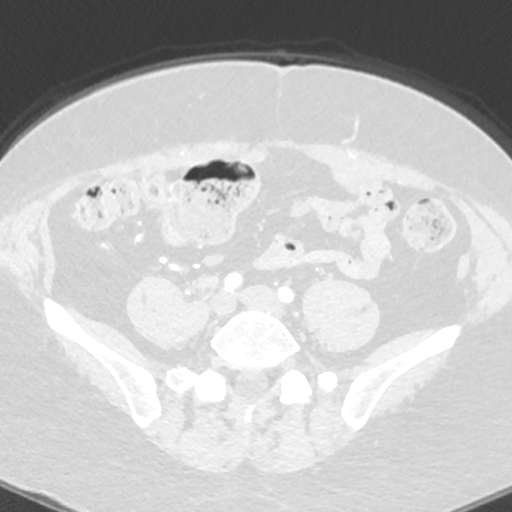
[im 242/686  mediastinal]
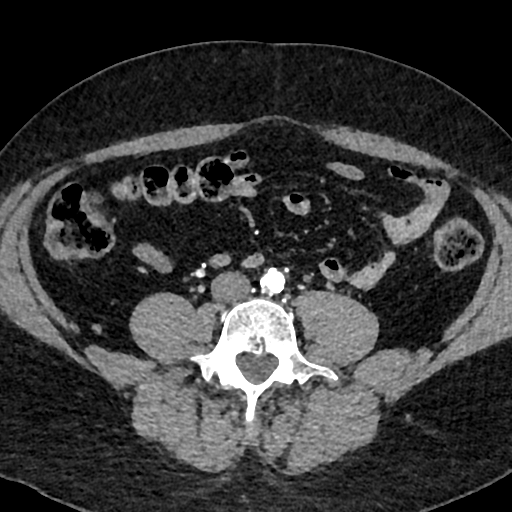
[im 283/686  lung]
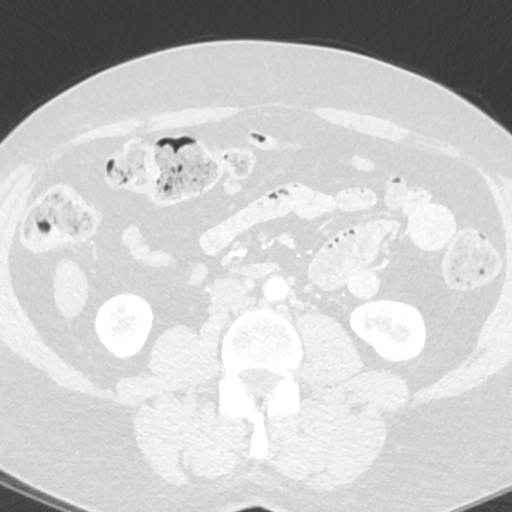
[im 363/686  mediastinal]
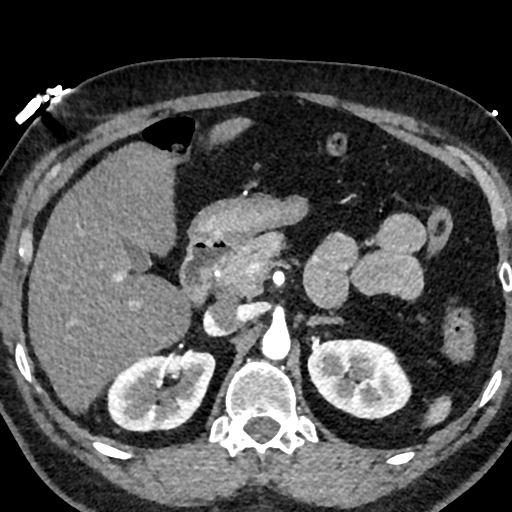
[im 403/686  lung]
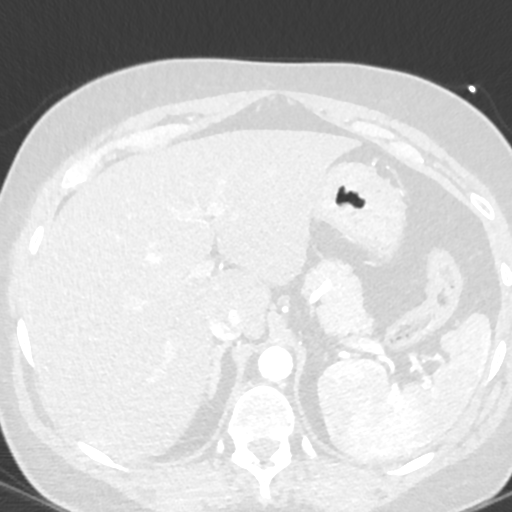
[im 444/686  mediastinal]
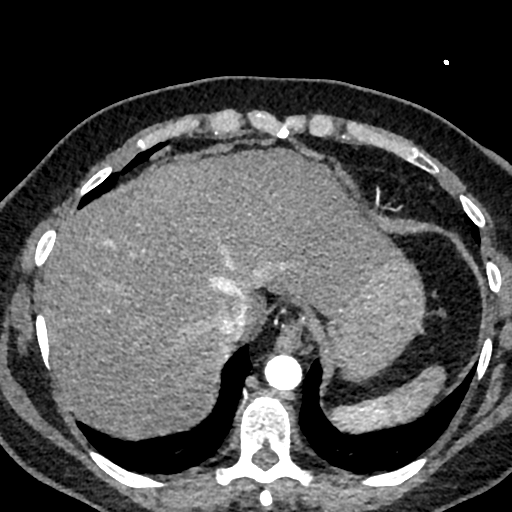
[im 484/686  lung]
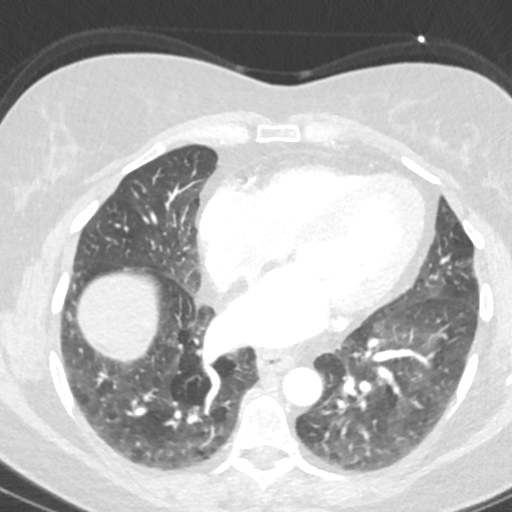
[im 524/686  mediastinal]
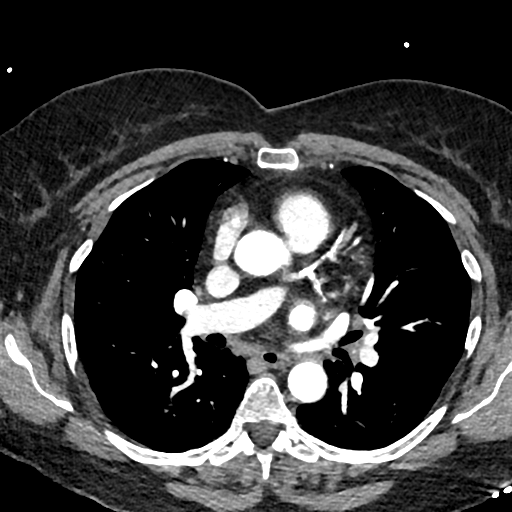
[im 565/686  lung]
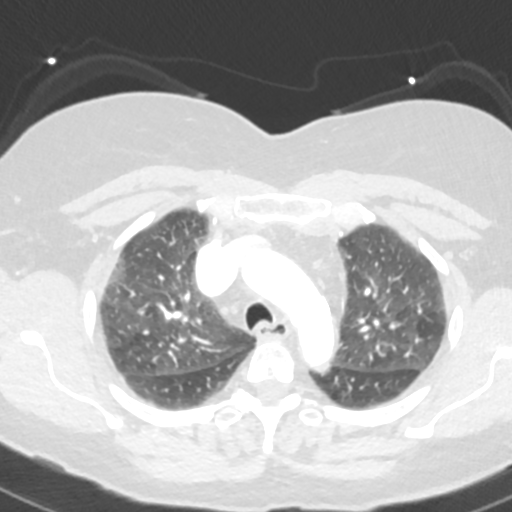
[im 605/686  mediastinal]
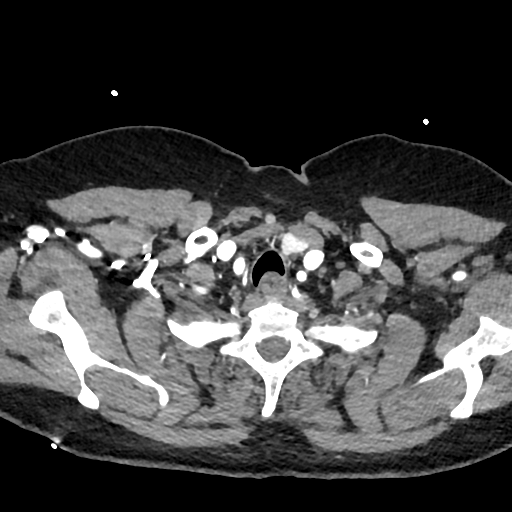
[im 645/686  lung]
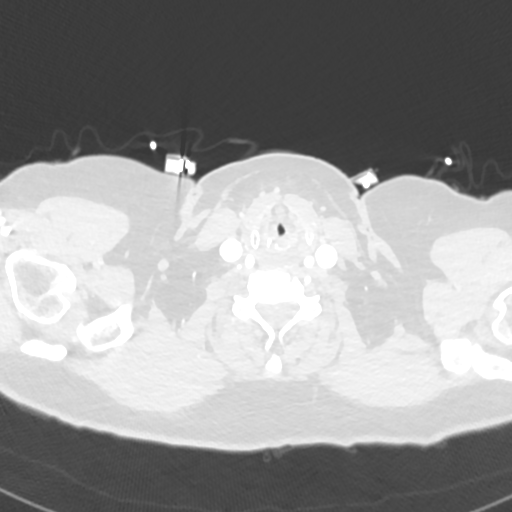

[Series 9: cor · coronal · 0.75mm/px · 1 of 149 slices shown]
[im 75/149  mediastinal]
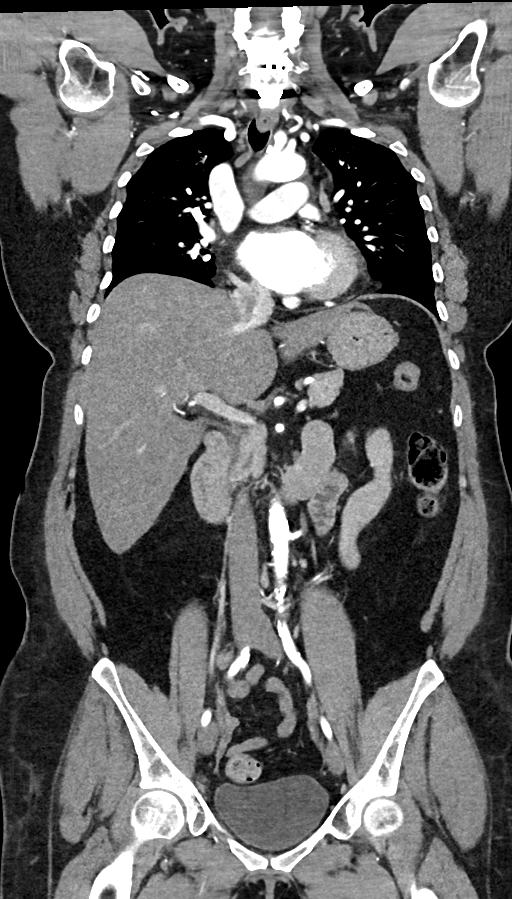

[16 of 36 positions shown; findings below may reference images not displayed]

FINDINGS: CTA CHEST FINDINGS

Cardiovascular: Heart size is normal. There is no significant
pericardial fluid, thickening or pericardial calcification. There is
aortic atherosclerosis, as well as atherosclerosis of the great
vessels of the mediastinum and the coronary arteries, including
calcified atherosclerotic plaque in the left main, left anterior
descending, left circumflex and right coronary arteries. Severe
thickening and calcification of the aortic valve.

Mediastinum/Lymph Nodes: No pathologically enlarged mediastinal or
hilar lymph nodes. Esophagus is unremarkable in appearance. No
axillary lymphadenopathy.

Lungs/Pleura: No suspicious pulmonary nodules or masses are noted.
No acute consolidative airspace disease. No pleural effusions.

Musculoskeletal/Soft Tissues: Orthopedic fixation hardware in the
lower cervical spine. There are no aggressive appearing lytic or
blastic lesions noted in the visualized portions of the skeleton.

CTA ABDOMEN AND PELVIS FINDINGS

Hepatobiliary: No suspicious cystic or solid hepatic lesions. No
intra or extrahepatic biliary ductal dilatation. Gallbladder is
normal in appearance.

Pancreas: No pancreatic mass. No pancreatic ductal dilatation. No
pancreatic or peripancreatic fluid collections or inflammatory
changes.

Spleen: Unremarkable.

Adrenals/Urinary Tract: 3 mm nonobstructive calculus in the lower
pole collecting system of the left kidney. Mild cortical scarring in
the left kidney. No suspicious renal lesions. Bilateral adrenal
glands are normal in appearance. No hydroureteronephrosis. Urinary
bladder is normal in appearance.

Stomach/Bowel: Normal appearance of the stomach. No pathologic
dilatation of small bowel or colon. Normal appendix.

Vascular/Lymphatic: Aortic atherosclerosis, without evidence of
aneurysm or dissection in the abdominal or pelvic vasculature.
Vascular findings and measurements pertinent to potential TAVR
procedure, as detailed below. No lymphadenopathy noted in the
abdomen or pelvis.

Reproductive: Status post hysterectomy.  Ovaries are atrophic.

Other: No significant volume of ascites.  No pneumoperitoneum.

Musculoskeletal: There are no aggressive appearing lytic or blastic
lesions noted in the visualized portions of the skeleton.

VASCULAR MEASUREMENTS PERTINENT TO TAVR:

AORTA:

Minimal Aortic [MV] x 8 mm

Severity of Aortic Calcification-moderate

RIGHT PELVIS:

Right Common Iliac Artery -

Minimal [MV] x 6.3 mm

Tortuosity - mild

Calcification-mild

Right External Iliac Artery -

Minimal [MV] x 6.1 mm

Tortuosity - mild

Calcification-none

Right Common Femoral Artery -

Minimal [MV] x 4.2 mm

Tortuosity - mild

Calcification-mild

LEFT PELVIS:

Left Common Iliac Artery -

Minimal [MV] x 7.1 mm

Tortuosity - mild

Calcification-mild

Left External Iliac Artery -

Minimal [MV] x 5.7 mm

Tortuosity - mild

Calcification-none

Left Common Femoral Artery -

Minimal [MV] x 5.1 mm

Tortuosity-mild

Calcification-mild

Review of the MIP images confirms the above findings.
IMPRESSION: 1. Vascular findings and measurements pertinent to potential TAVR
procedure, as detailed above.
2. Severe thickening calcification of the aortic valve, compatible
with reported clinical history of severe aortic stenosis.
3. 3 mm nonobstructive calculus in the lower pole collecting system
of left kidney. No ureteral stones or findings of urinary tract
obstruction.
4. Additional incidental findings, as above.

## 2019-09-30 IMAGING — CT CT HEART MORP W/ CTA COR W/ SCORE W/ CA W/CM &/OR W/O CM
2 of 6 series · 12 of 20 positions shown, 14 images · IV contrast (APPLIED)
Comparison: None.
COMPARISON: None.

Addendum:
EXAM:
OVER-READ INTERPRETATION  CT CHEST

The following report is an over-read performed by radiologist Dr.
KHO [REDACTED] on [DATE]. This
over-read does not include interpretation of cardiac or coronary
anatomy or pathology. The coronary calcium score/coronary CTA
interpretation by the cardiologist is attached.
CLINICAL DATA: Severe Aortic Stenosis.
Cardiac TAVR CT
TECHNIQUE: The patient was scanned on a [employer] Force scanner. A 120 kV
retrospective scan was triggered in the descending thoracic aorta at
111 HU's. Gantry rotation speed was 250 msecs and collimation was .6
mm. 5 mg IV metoprolol given. No nitroglycerin administered. The 3D
data set was reconstructed in 5% intervals of the R-R cycle.
Systolic and diastolic phases were analyzed on a dedicated work
station using MPR, MIP and VRT modes. The patient received 80 cc of
contrast.

[Series 5: 0-90% · axial · 0.39mm/px · z∈[-105,+22]mm · 6 of 2970 slices shown, 8 images]
[im 425/2970  vessel]
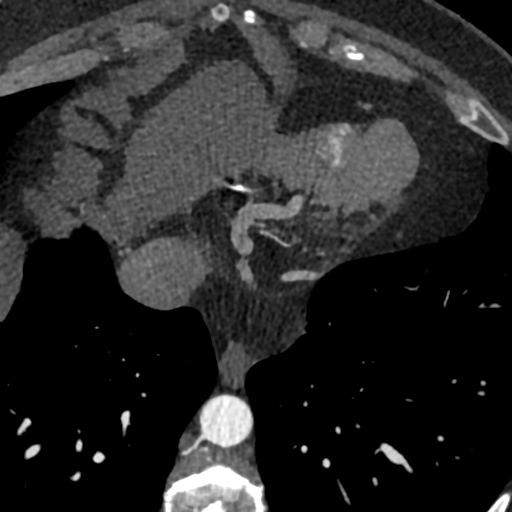
[im 425/2970  lung]
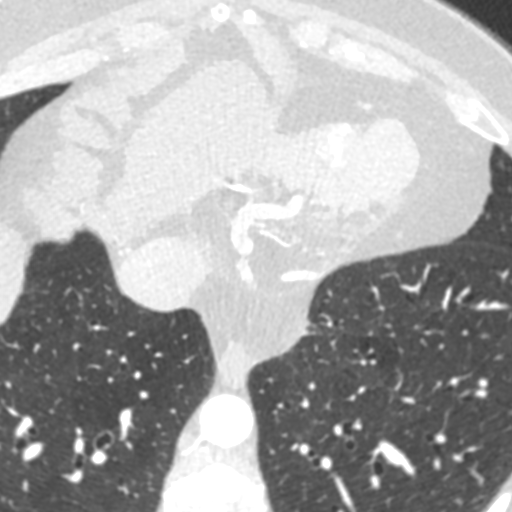
[im 849/2970  vessel]
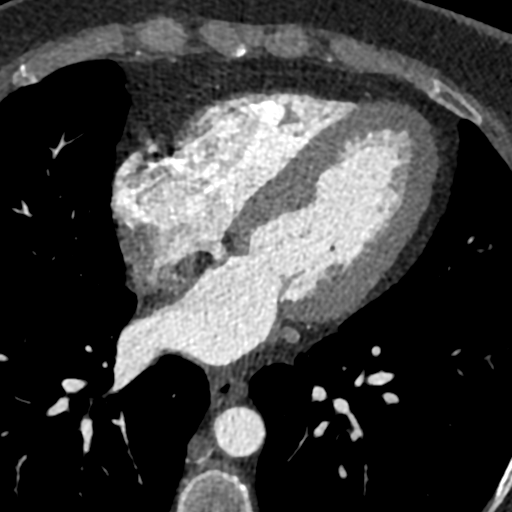
[im 1273/2970  vessel]
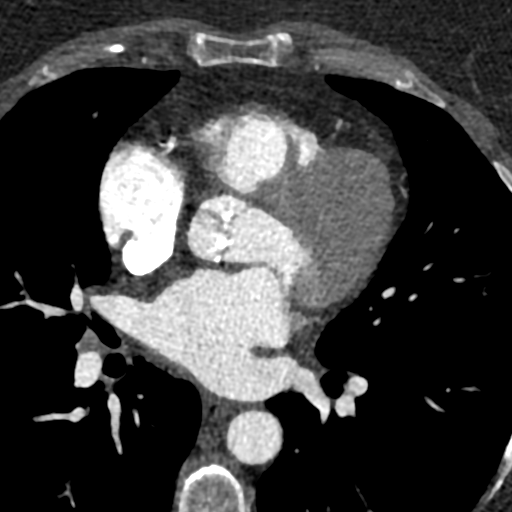
[im 1697/2970  vessel]
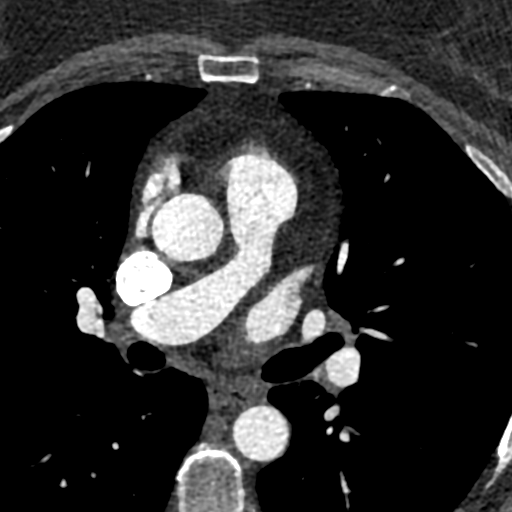
[im 2121/2970  vessel]
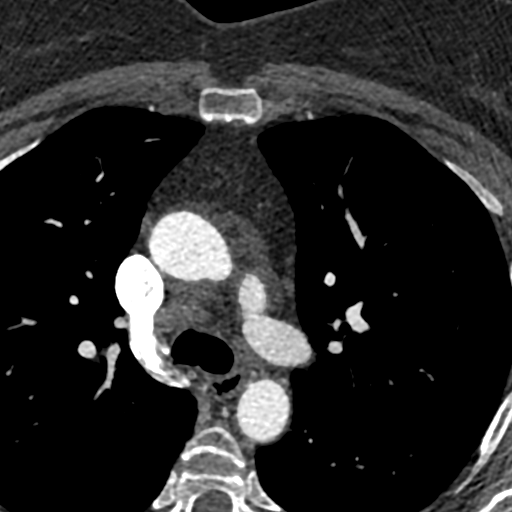
[im 2121/2970  lung]
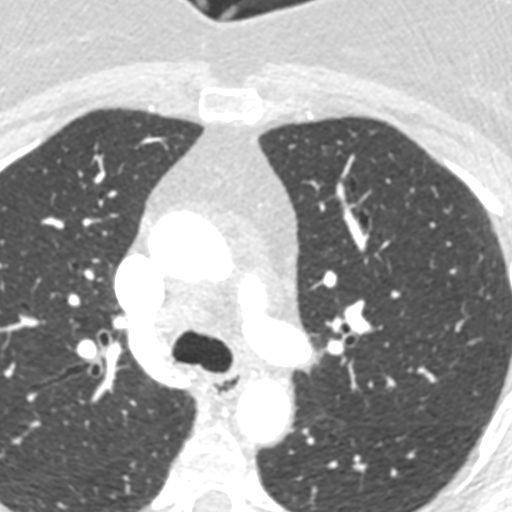
[im 2545/2970  vessel]
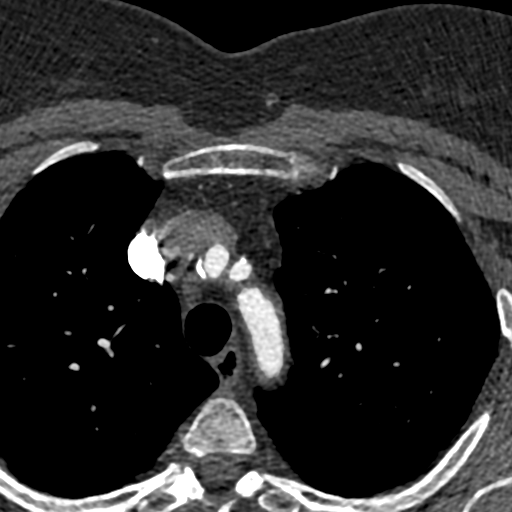

[Series 6: 5-95% · axial · 0.39mm/px · z∈[-105,+22]mm · 6 of 2970 slices shown]
[im 425/2970  vessel]
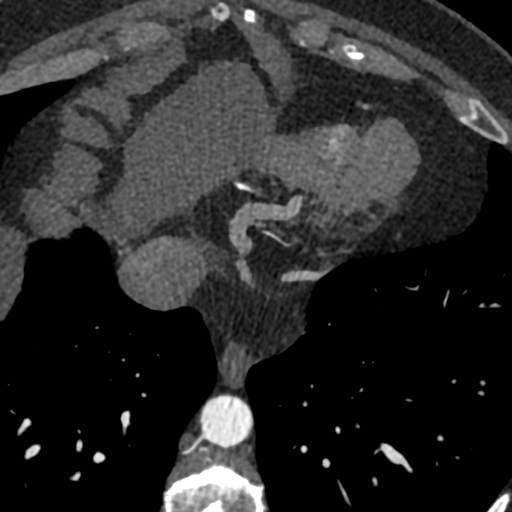
[im 849/2970  vessel]
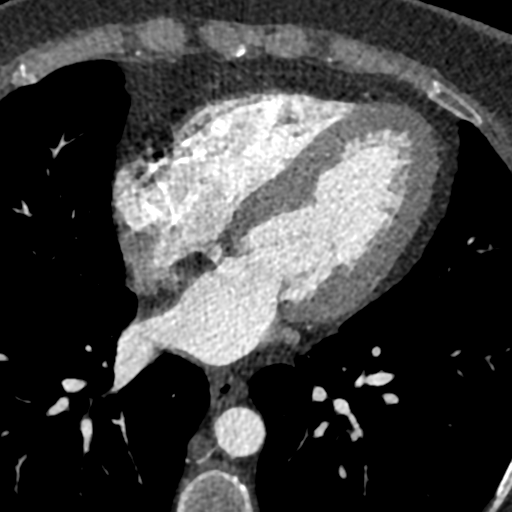
[im 1273/2970  vessel]
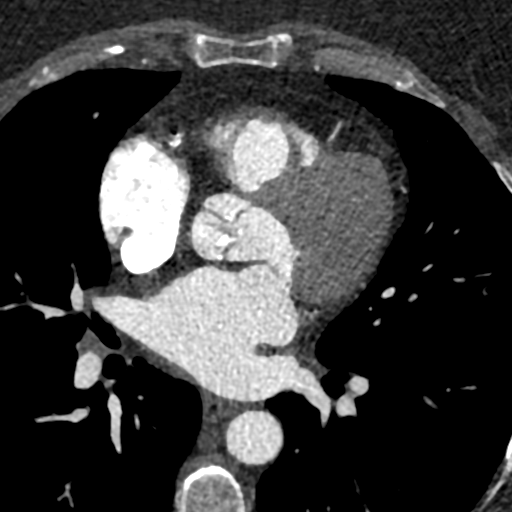
[im 1697/2970  vessel]
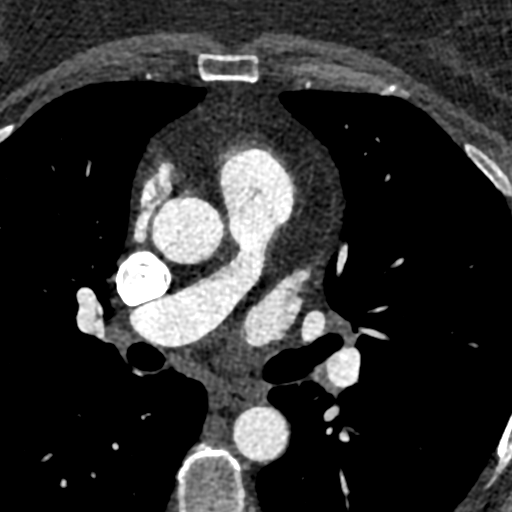
[im 2121/2970  vessel]
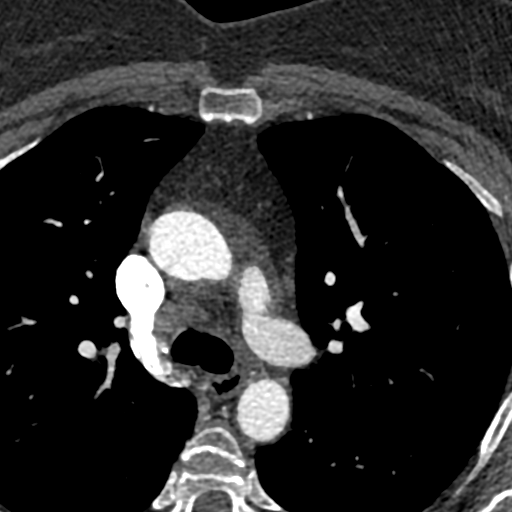
[im 2545/2970  vessel]
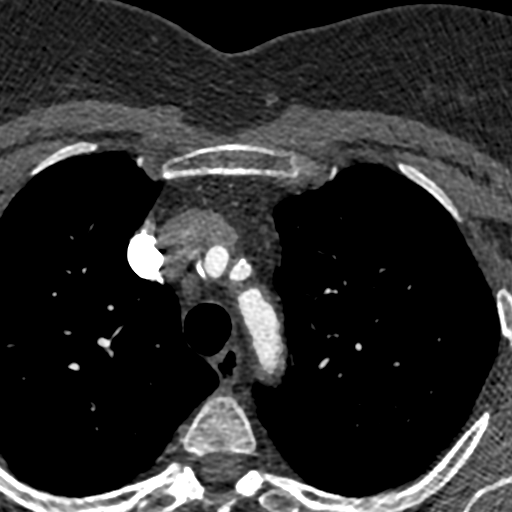

[12 of 20 positions shown; findings below may reference images not displayed]

FINDINGS: Extracardiac findings are described separately under dictation for
contemporaneously obtained CTA chest, abdomen and pelvis.
IMPRESSION: Please see separate dictation for contemporaneously obtained CTA
chest, abdomen and pelvis dated [DATE] for full description of
relevant extracardiac findings.
FINDINGS: Image quality: Excellent.

Noise artifact is: Limited.

Valve Morphology: The aortic valve is tricuspid with severely
restricted leaflet motion is systole. The NCC/RCC are severely
calcified with bulky calcifications.

Aortic Valve Calcium score: [BZ]

Aortic annular dimension:

Phase assessed: 20%

Annular area: 475 mm2

Annular perimeter: 78.2 mm

Max diameter: 27 mm

Min diameter: 22.6 mm

Annular and subannular calcification: There is moderate annular
calcification. There is a single protruding calcification under the
NCC extending into the LVOT.

Optimal coplanar projection: LAO 10 KHO 2

Coronary Artery Height above Annulus:

Left Main: 13.5 mm

Right Coronary: 18.3 mm

Sinus of Valsalva Measurements:

Non-coronary: 28 mm

Right-coronary: 28 mm

Left-coronary: 28 mm

Sinus of Valsalva Height:

Non-coronary: 19.3 mm

Right-coronary:

Left-coronary: 18.4 mm

Sinotubular Junction: 23 mm

Ascending Thoracic Aorta: 26 mm

Coronary Arteries: Normal coronary origin. Right dominance. The
study was performed without use of NTG and is insufficient for
plaque evaluation. Please refer to recent cardiac catheterization
for coronary assessment.

Cardiac Morphology:

Right Atrium: Right atrial size is within normal limits.

Right Ventricle: The right ventricular cavity is within normal
limits.

Left Atrium: Left atrial size is normal in size with no left atrial
appendage filling defect.

Left Ventricle: The ventricular cavity size is within normal limits.
There are no stigmata of prior infarction. There is no abnormal
filling defect. LVEF=77%. Normal wall motion.

Pulmonary arteries: Normal in size without proximal filling defect.

Pulmonary veins: Normal pulmonary venous drainage.

Pericardium: Normal thickness with no significant effusion or
calcium present.

Mitral Valve: The mitral valve is normal structure with mild annular
calcification.

Extra-cardiac findings: See attached radiology report for
non-cardiac structures.
IMPRESSION: 1. Annular measurements appropriate for 26 mm KHO3 TAVR (475
mm2).

2. Moderate (single protruding calcification) annular calcification
under the NCC extending into the LVOT.

3. Sufficient coronary to annulus distance.

4. Optimal Fluoroscopic Angle for Delivery: LAO 10 KHO 2

*** End of Addendum ***
EXAM:
OVER-READ INTERPRETATION  CT CHEST

The following report is an over-read performed by radiologist Dr.
KHO [REDACTED] on [DATE]. This
over-read does not include interpretation of cardiac or coronary
anatomy or pathology. The coronary calcium score/coronary CTA
interpretation by the cardiologist is attached.
FINDINGS: Extracardiac findings are described separately under dictation for
contemporaneously obtained CTA chest, abdomen and pelvis.
IMPRESSION: Please see separate dictation for contemporaneously obtained CTA
chest, abdomen and pelvis dated [DATE] for full description of
relevant extracardiac findings.

## 2019-10-20 IMAGING — CR DG CHEST 2V
2 series · 2 of 2 positions shown · non-contrast
Comparison: [DATE]

CLINICAL DATA: Preop evaluation for upcoming aortic valve
replacement

EXAM:
CHEST - 2 VIEW

[w chest pa]
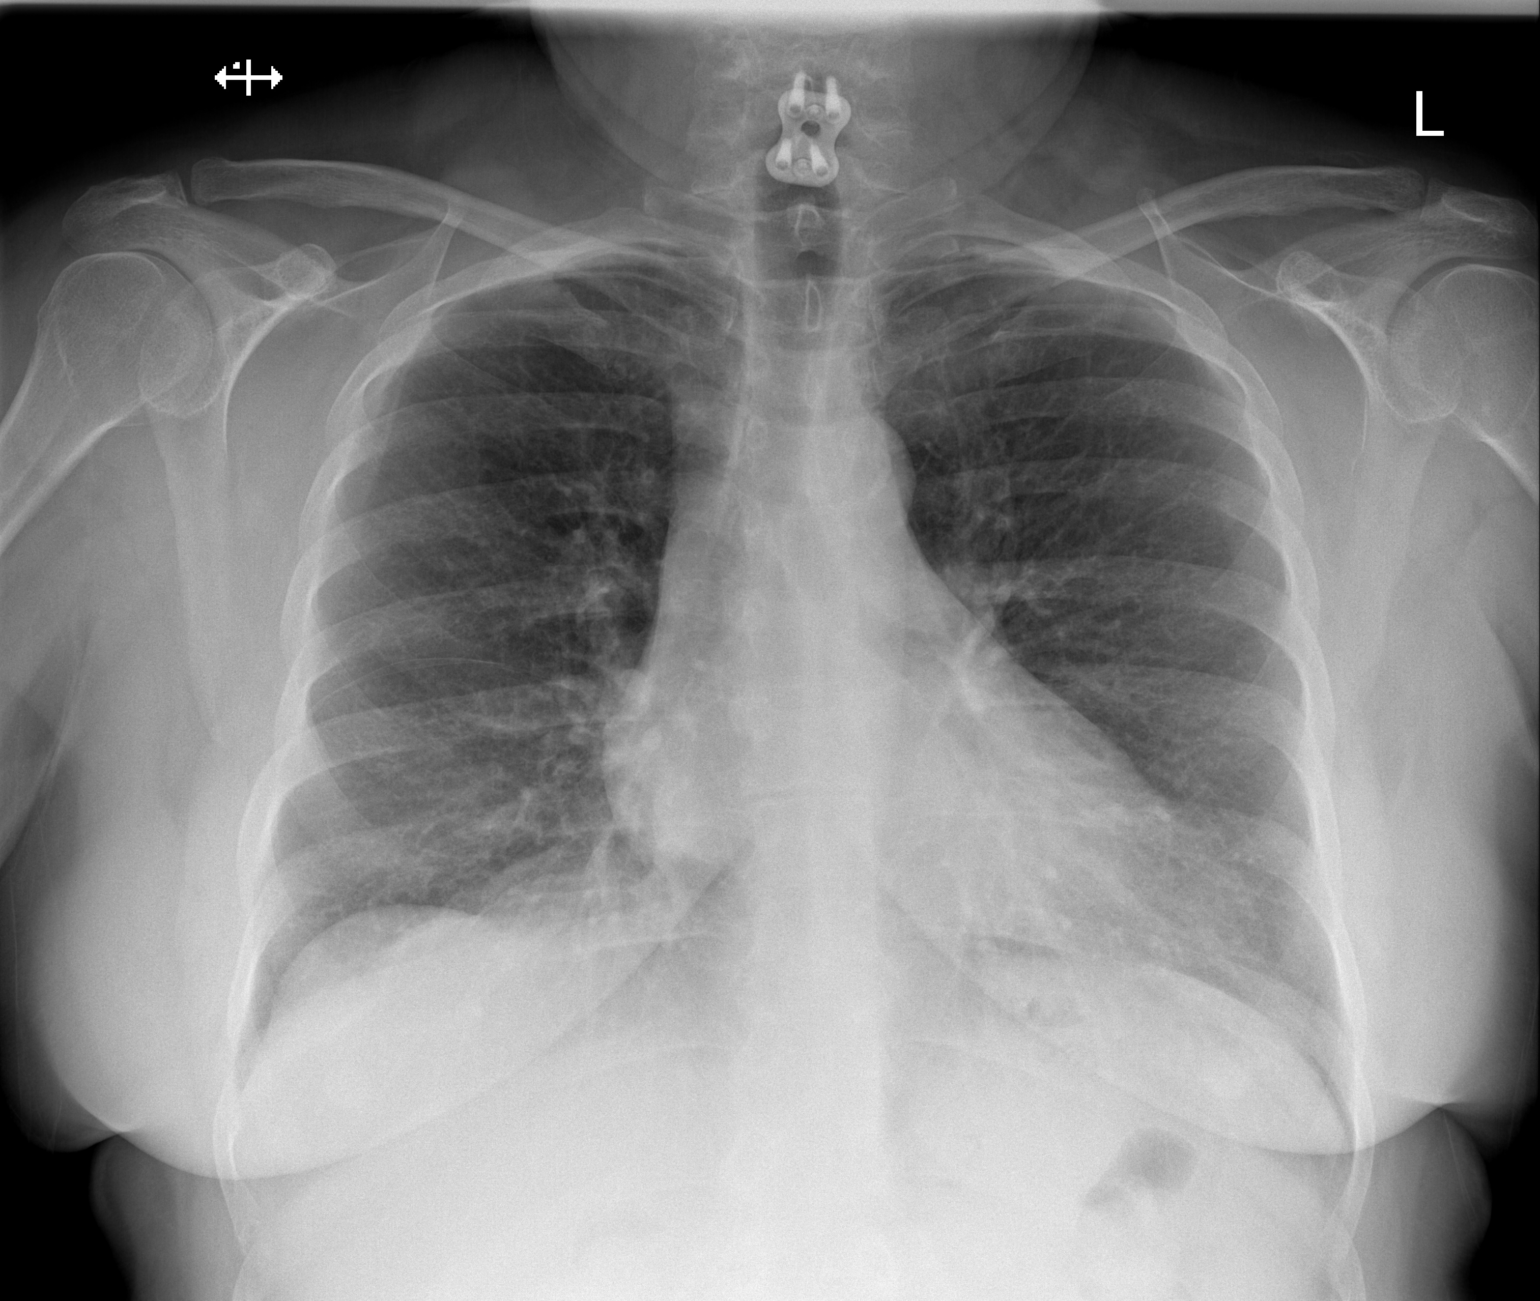

[w chest lat]
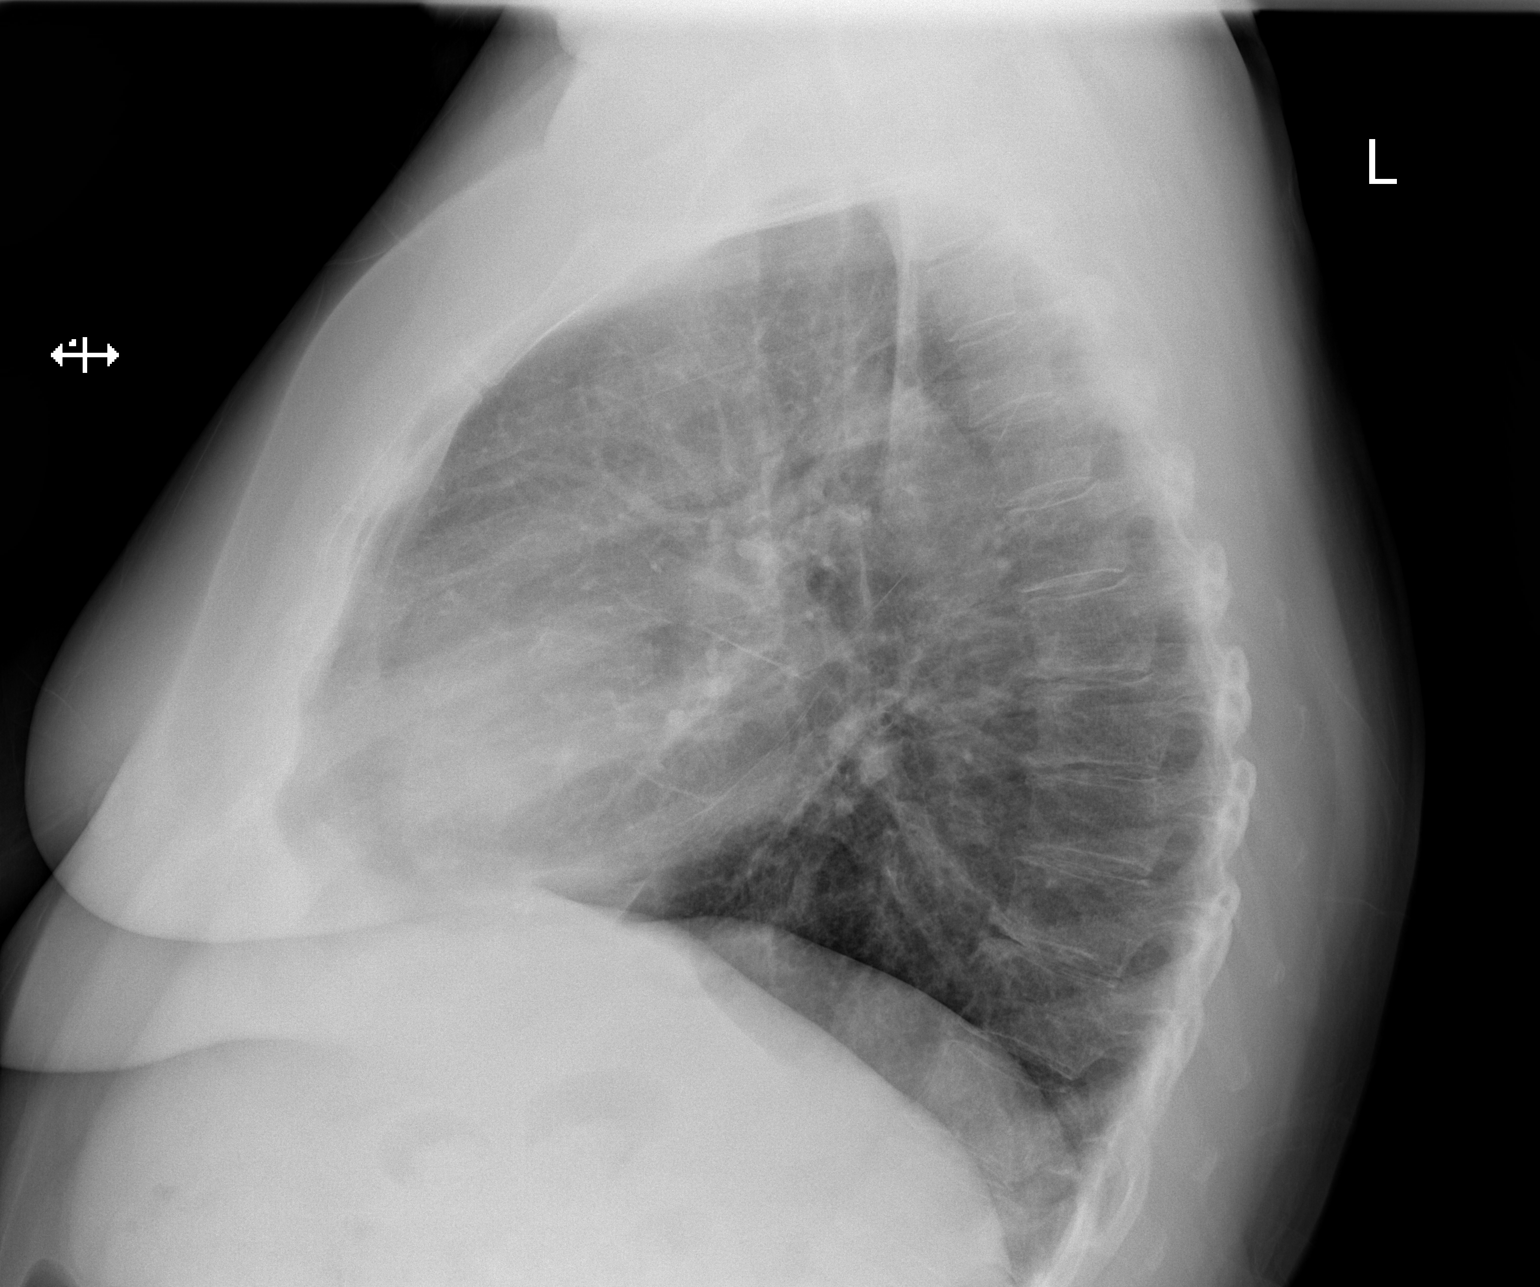

[2 of 2 positions shown; findings below may reference images not displayed]

FINDINGS: Cardiac shadow is stable. Lungs are clear bilaterally. No focal
infiltrate or effusion is seen. Mild degenerative changes of the
thoracic spine are noted. Postsurgical changes of the cervical spine
are seen.
IMPRESSION: No acute abnormality noted.

## 2019-10-22 IMAGING — DX DG CHEST 1V PORT
1 series · 1 of 1 positions shown · non-contrast
Comparison: [DATE]

CLINICAL DATA: Status post aortic valve replacement and atrial
appendage clipping

EXAM:
PORTABLE CHEST 1 VIEW

[chest ap]
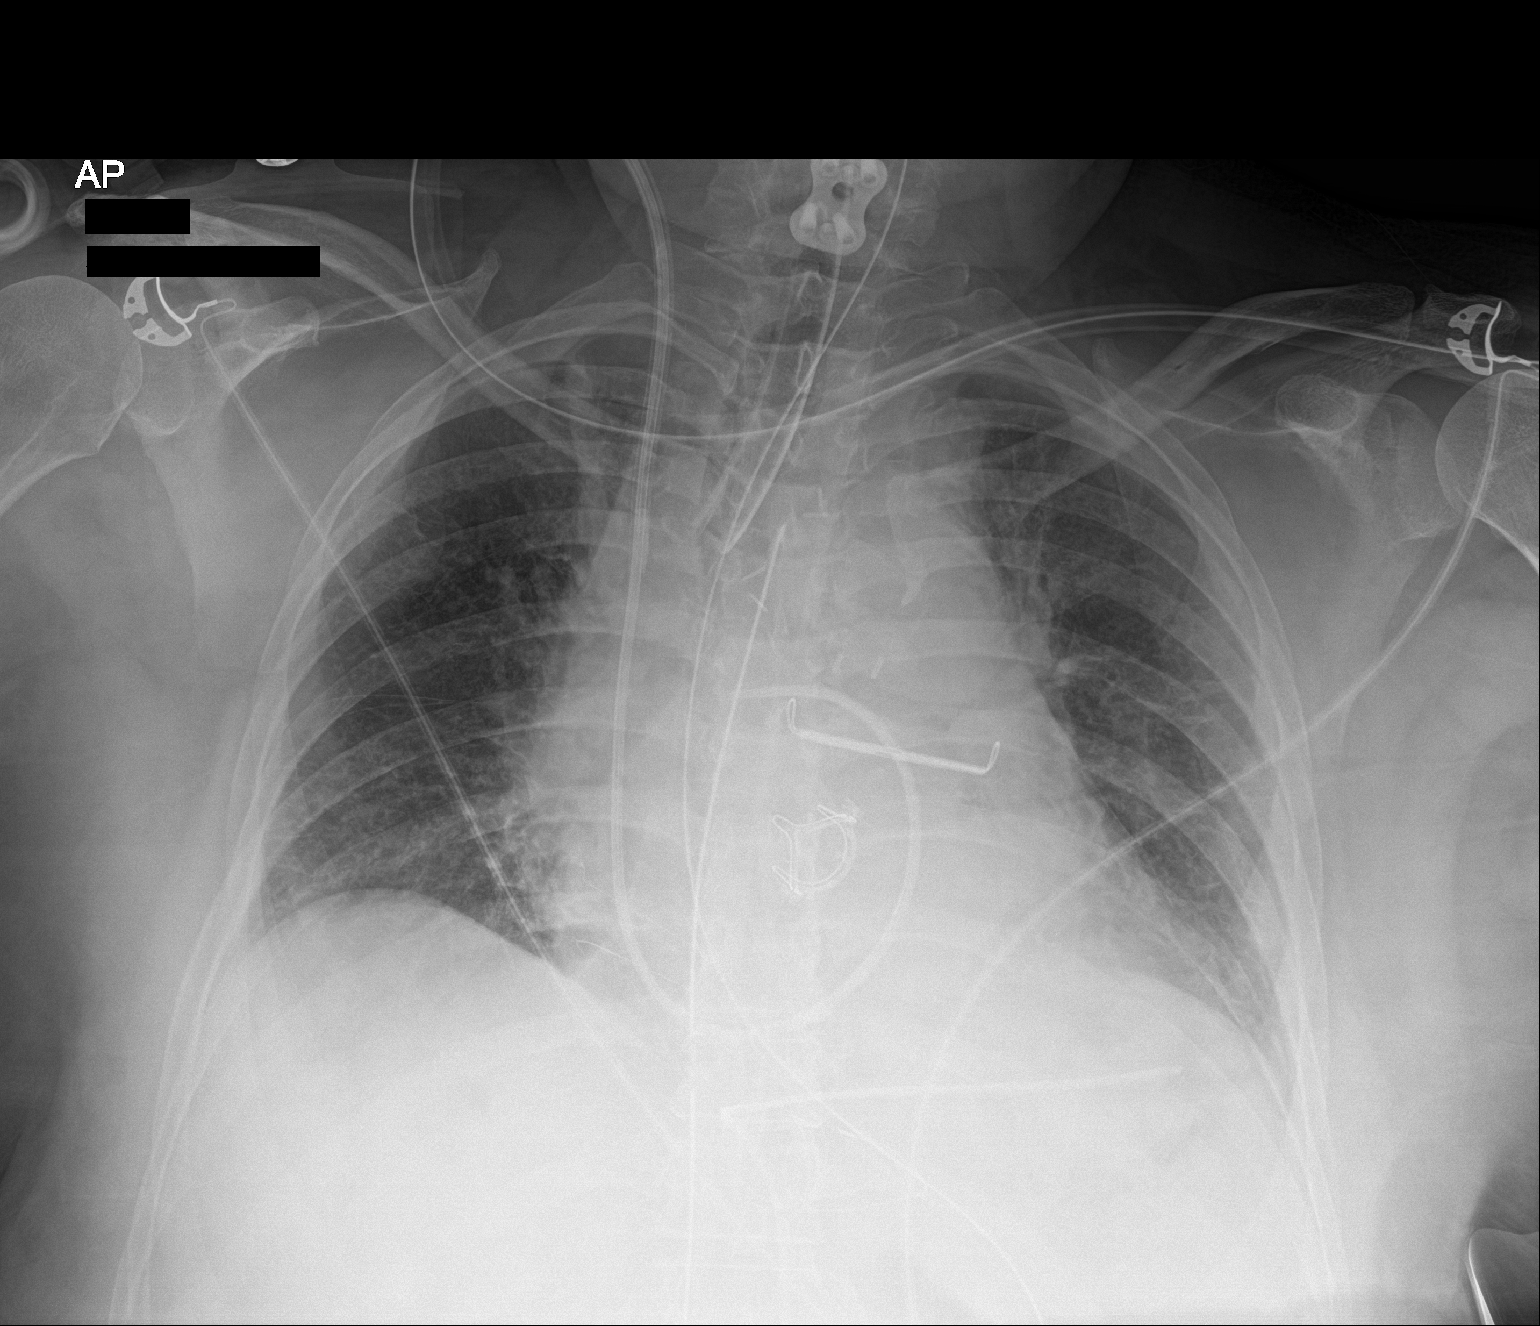

[1 of 1 positions shown; findings below may reference images not displayed]

FINDINGS: Endotracheal tube, gastric catheter, mediastinal drain and Swan-Ganz
catheter are noted in satisfactory position. Changes of atrial
clipping aortic valve replacement are seen. Pericardial drain is
noted as well. Lungs are hypoinflated without focal infiltrate or
sizable effusion. No bony abnormality is noted.
IMPRESSION: Postsurgical change with tubes and lines as described.

## 2019-10-23 IMAGING — DX DG CHEST 1V PORT
1 series · 1 of 1 positions shown · non-contrast
Comparison: [DATE]

CLINICAL DATA: Atelectasis.

EXAM:
PORTABLE CHEST 1 VIEW

[chest ap]
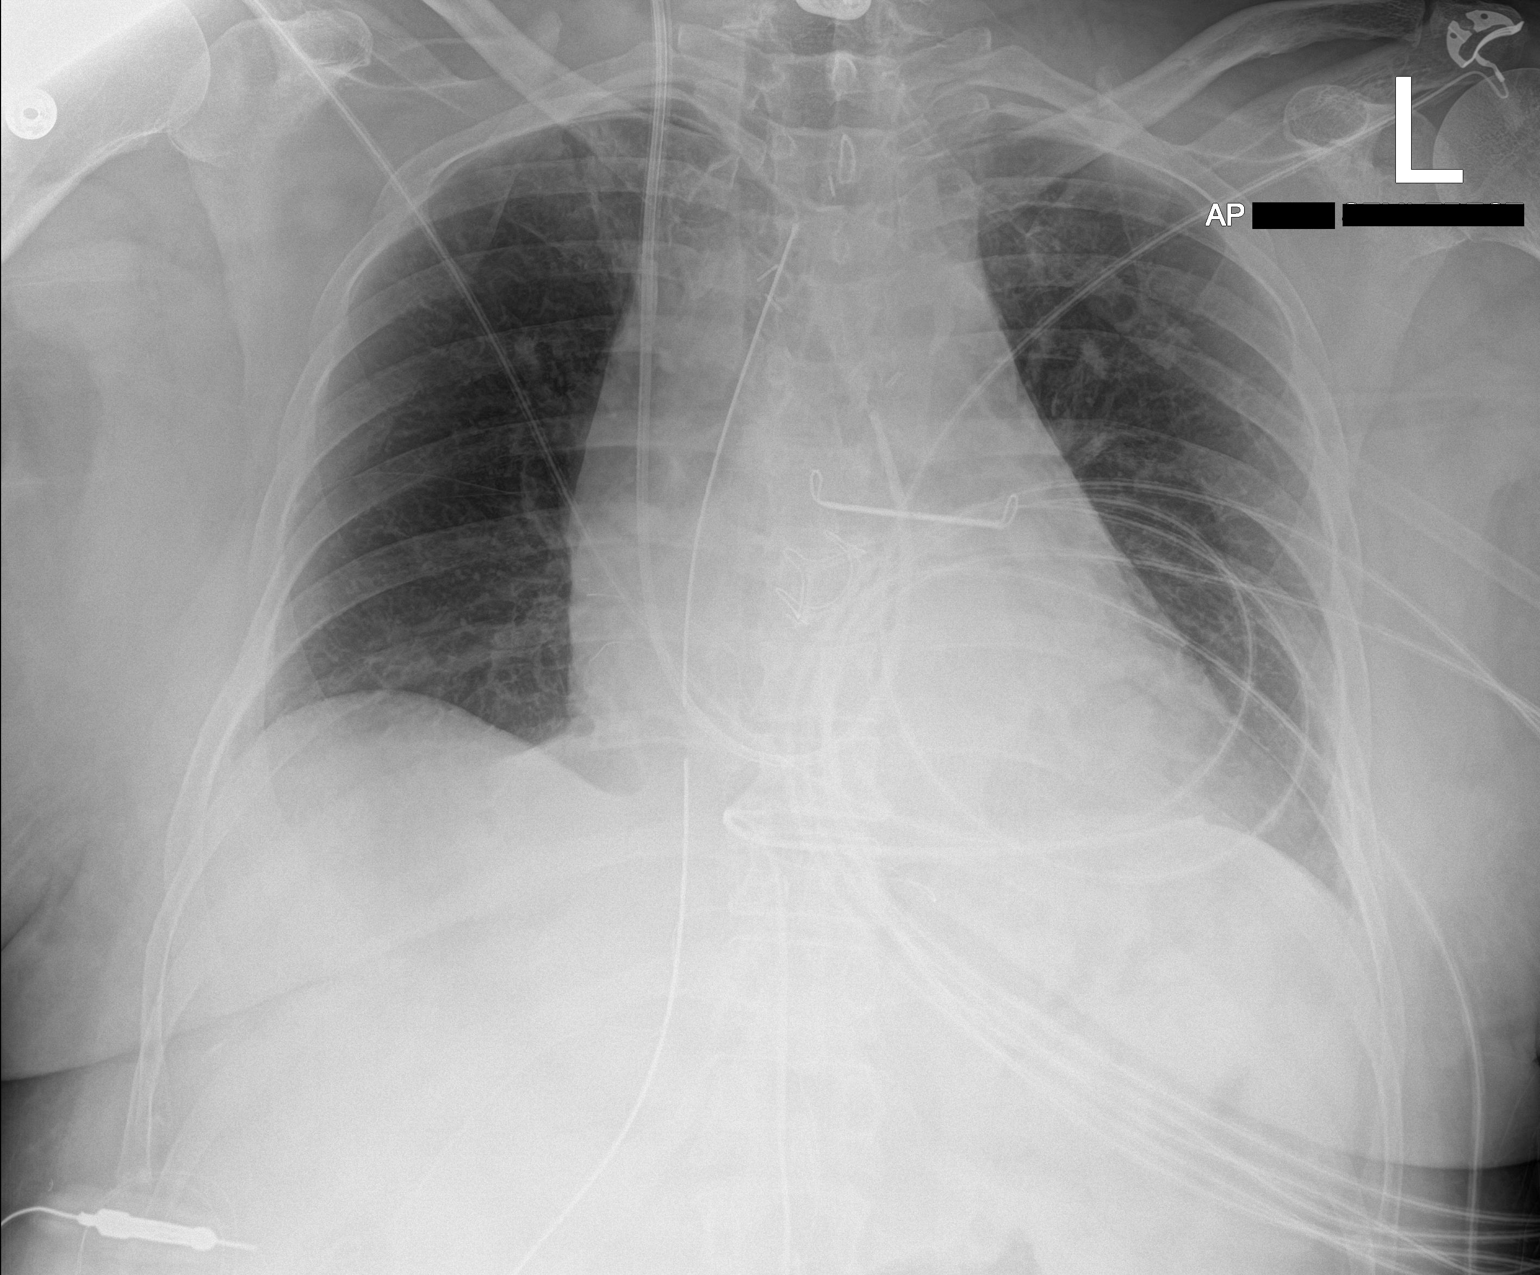

[1 of 1 positions shown; findings below may reference images not displayed]

FINDINGS: Right IJ Swan-Ganz catheter has tip over the main pulmonary artery
segment. Mediastinal drain unchanged. Catheter over the inferior
left heart border unchanged. Lungs are adequately inflated and
otherwise clear. Mild stable cardiomegaly. Remainder of the exam is
unchanged.
IMPRESSION: No acute cardiopulmonary disease.  Mild stable cardiomegaly.

Tubes and lines as described.

## 2019-10-24 IMAGING — DX DG CHEST 1V PORT
1 series · 1 of 1 positions shown · non-contrast
Comparison: [DATE]

CLINICAL DATA: Post aortic valve replacement.

EXAM:
PORTABLE CHEST 1 VIEW

[chest]
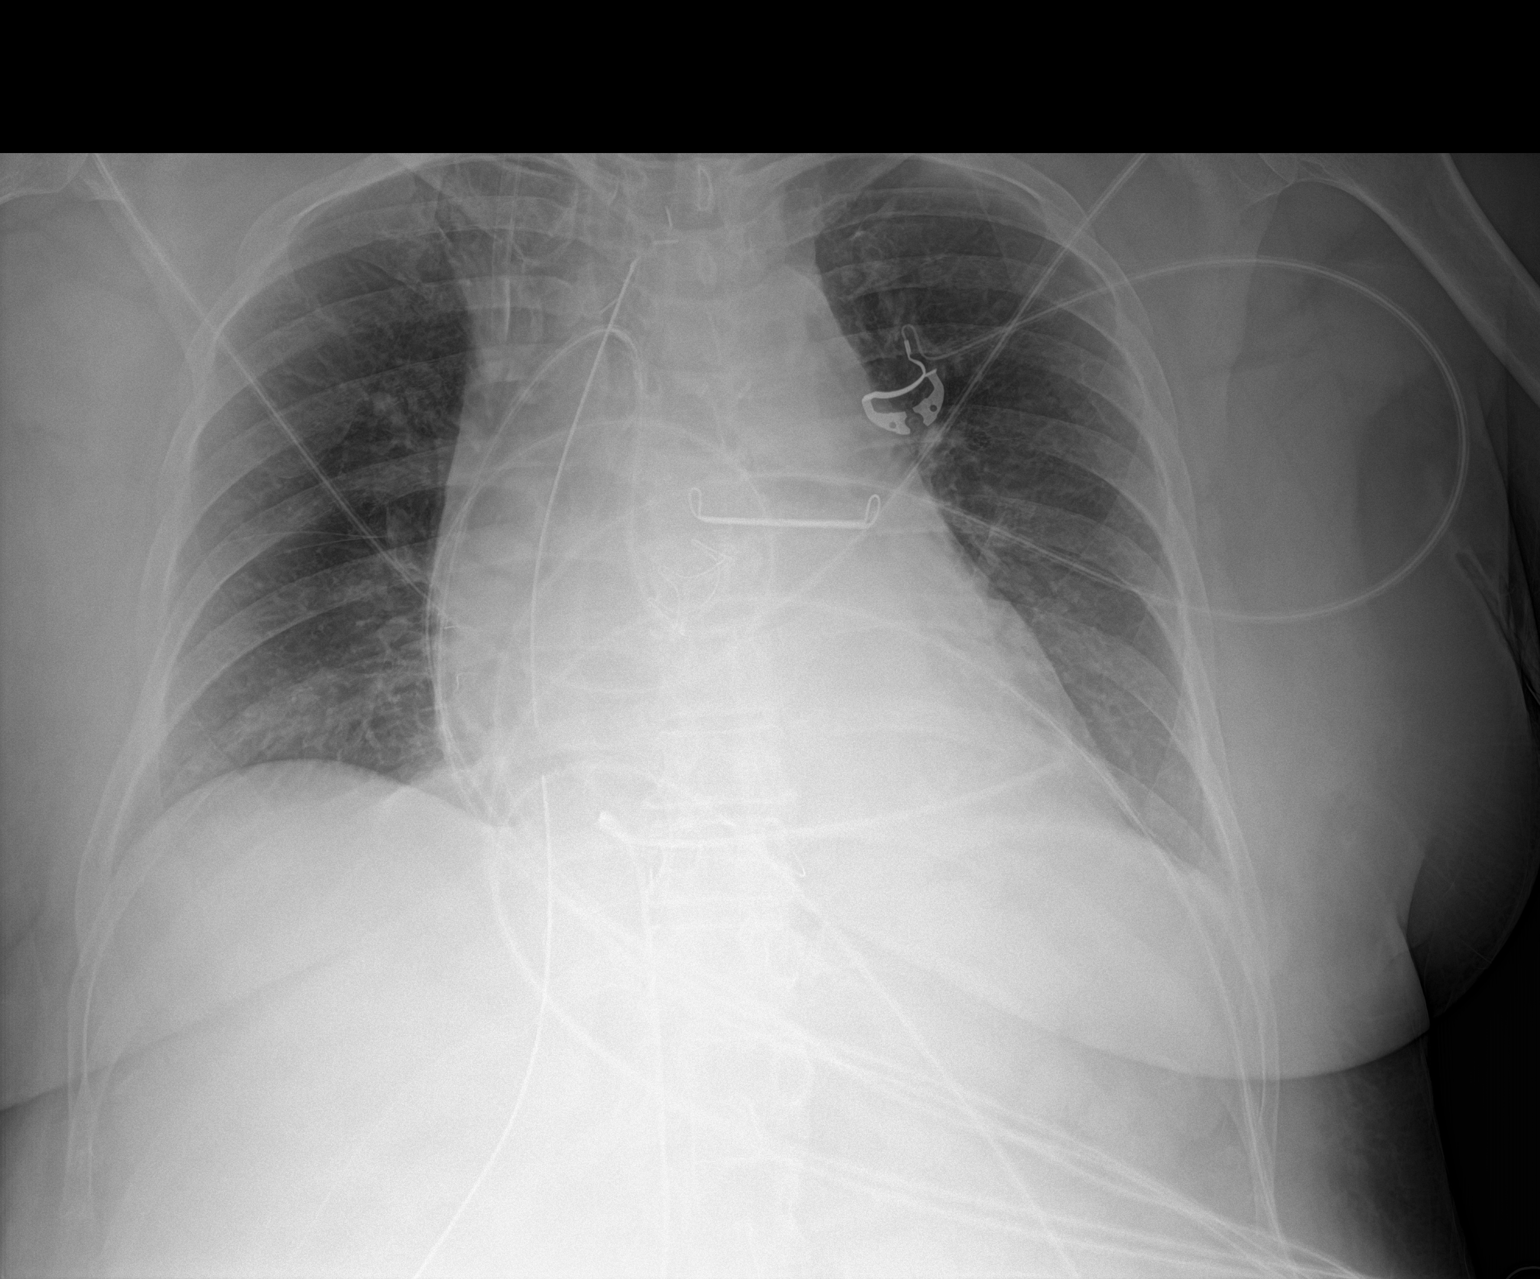

[1 of 1 positions shown; findings below may reference images not displayed]

FINDINGS: Right IJ central venous sheath with tip over the SVC. 2 mediastinal
drains unchanged. Evidence of patient's aortic valve replacement
unchanged. Lungs are adequately inflated with minimal left
base/retrocardiac opacification without significant change likely
atelectasis. No evidence of pneumothorax. Mild stable cardiomegaly.
Remainder of the exam is unchanged.
IMPRESSION: 1. Mild stable left base/retrocardiac opacification likely
atelectasis. Mild stable cardiomegaly.

2.  Tubes and lines as described.

## 2019-10-25 IMAGING — DX DG CHEST 1V PORT
1 series · 1 of 1 positions shown · non-contrast
Comparison: Single-view of the chest [DATE].

CLINICAL DATA: Status post aortic valve replacement [DATE].

EXAM:
PORTABLE CHEST 1 VIEW

[chest ap]
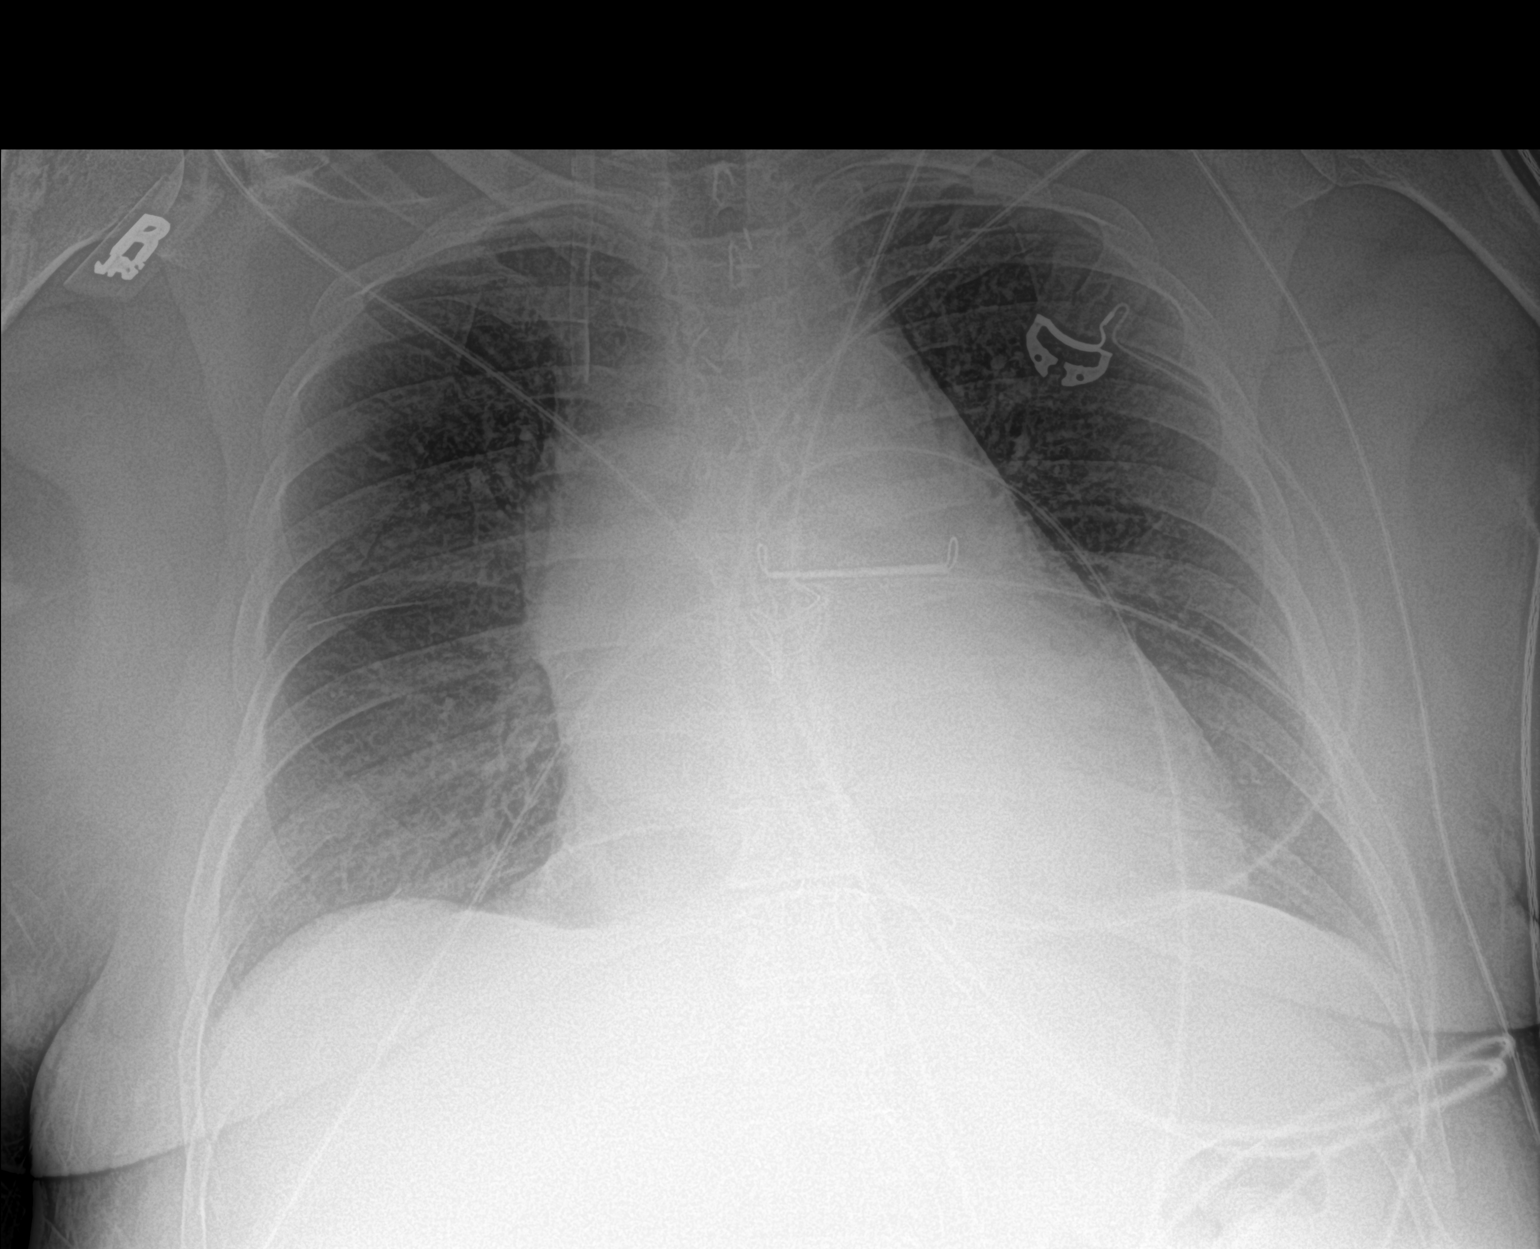

[1 of 1 positions shown; findings below may reference images not displayed]

FINDINGS: Chest tubes have been removed. Right IJ sheath remains in place.
Lungs are clear. No pneumothorax or pleural effusion. Clipping of
the atrial appendage and prosthetic aortic valve noted. Heart size
is normal.
IMPRESSION: Status post chest tube removal. Negative for pneumothorax. Lungs are
clear.

## 2019-10-26 IMAGING — DX DG CHEST 1V PORT
1 series · 1 of 1 positions shown · non-contrast
Comparison: Chest x-ray [DATE].

CLINICAL DATA: Status post AVR.

EXAM:
PORTABLE CHEST 1 VIEW

[chest ap]
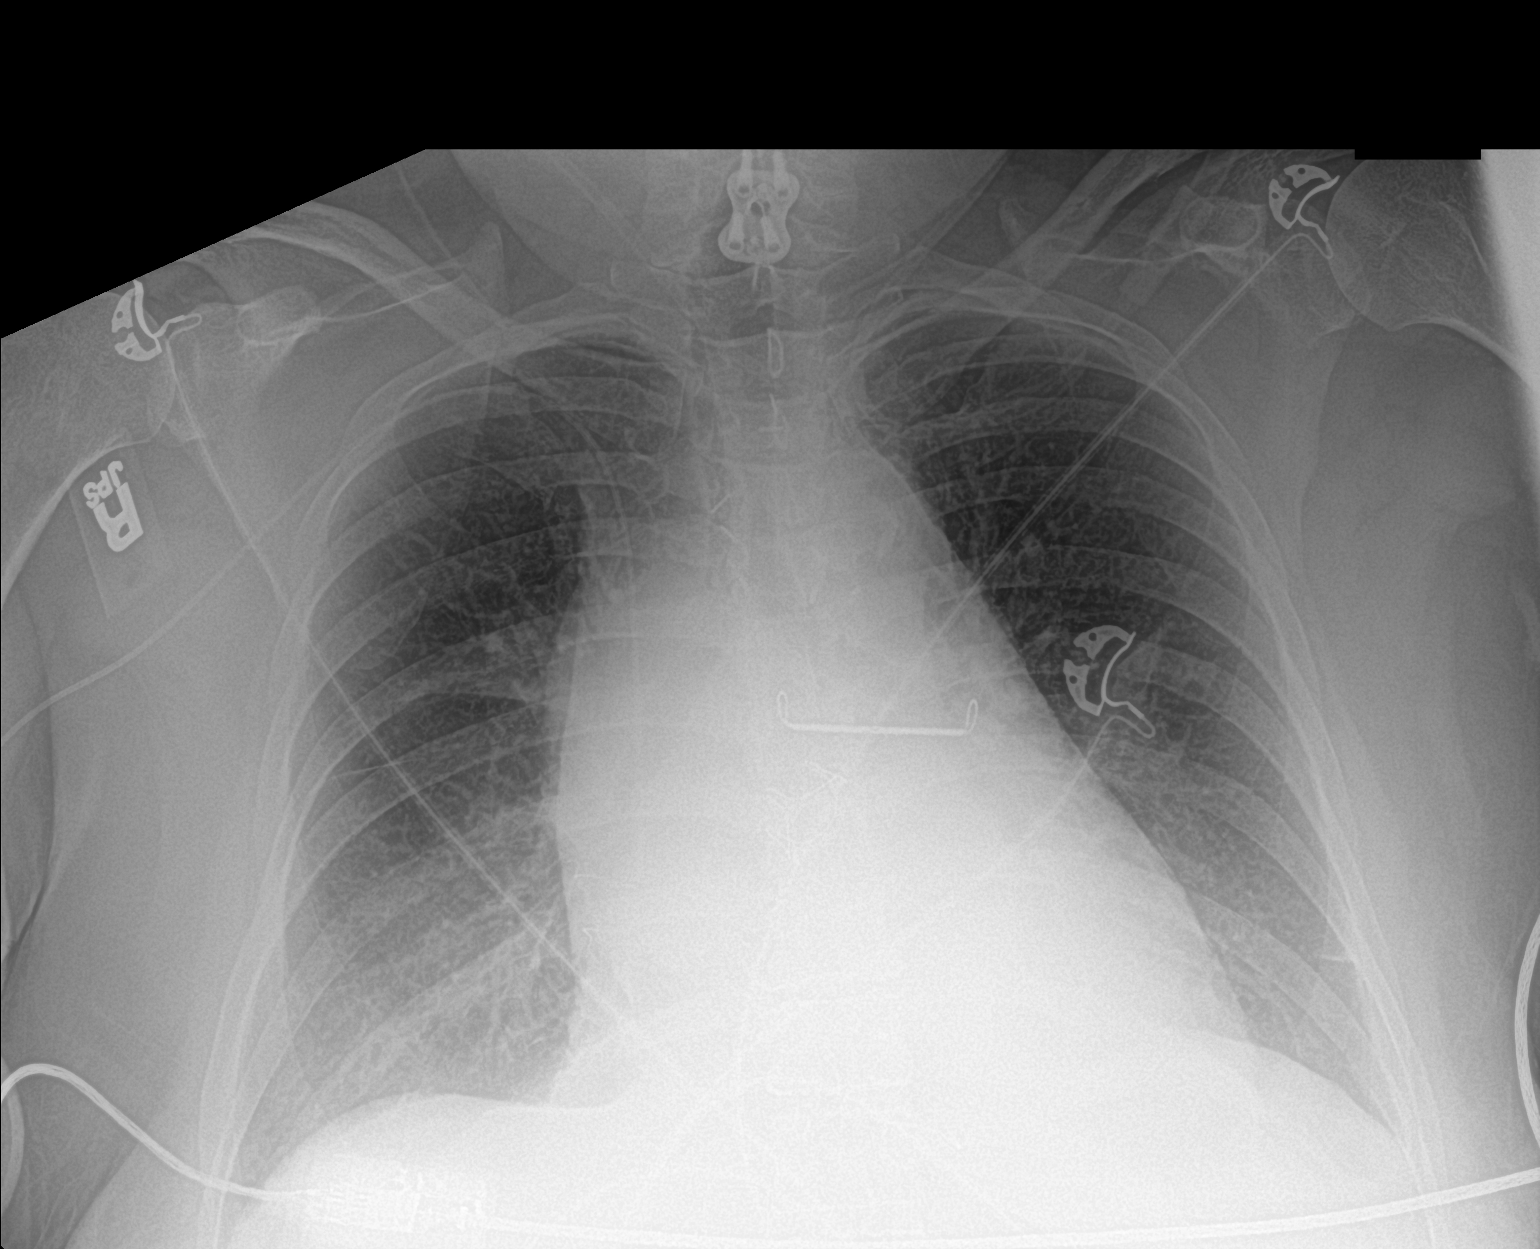

[1 of 1 positions shown; findings below may reference images not displayed]

FINDINGS: Interim removal of right IJ line. Interim placement of right PICC
line with its tip at cavoatrial junction. Prior cardiac valve
replacement. Left atrial appendage clip in stable position. Stable
cardiomegaly. No pulmonary venous congestion. Stable mild bilateral
interstitial prominence. No pleural effusion or pneumothorax. Prior
cervical spine fusion.
IMPRESSION: 1. Interim removal of right IJ line. Interim placement right PICC
line with its tip at the cavoatrial junction.

2. Prior cardiac valve replacement. Left atrial appendage clip in
stable position. Stable cardiomegaly. No pulmonary venous
congestion.

3.  Stable mild bilateral interstitial prominence.

## 2019-10-28 IMAGING — CR DG CHEST 2V
2 series · 2 of 2 positions shown · non-contrast
Comparison: [DATE].

CLINICAL DATA: Status post aortic valve replacement.  Hypertension.

EXAM:
CHEST - 2 VIEW

[chest ap]
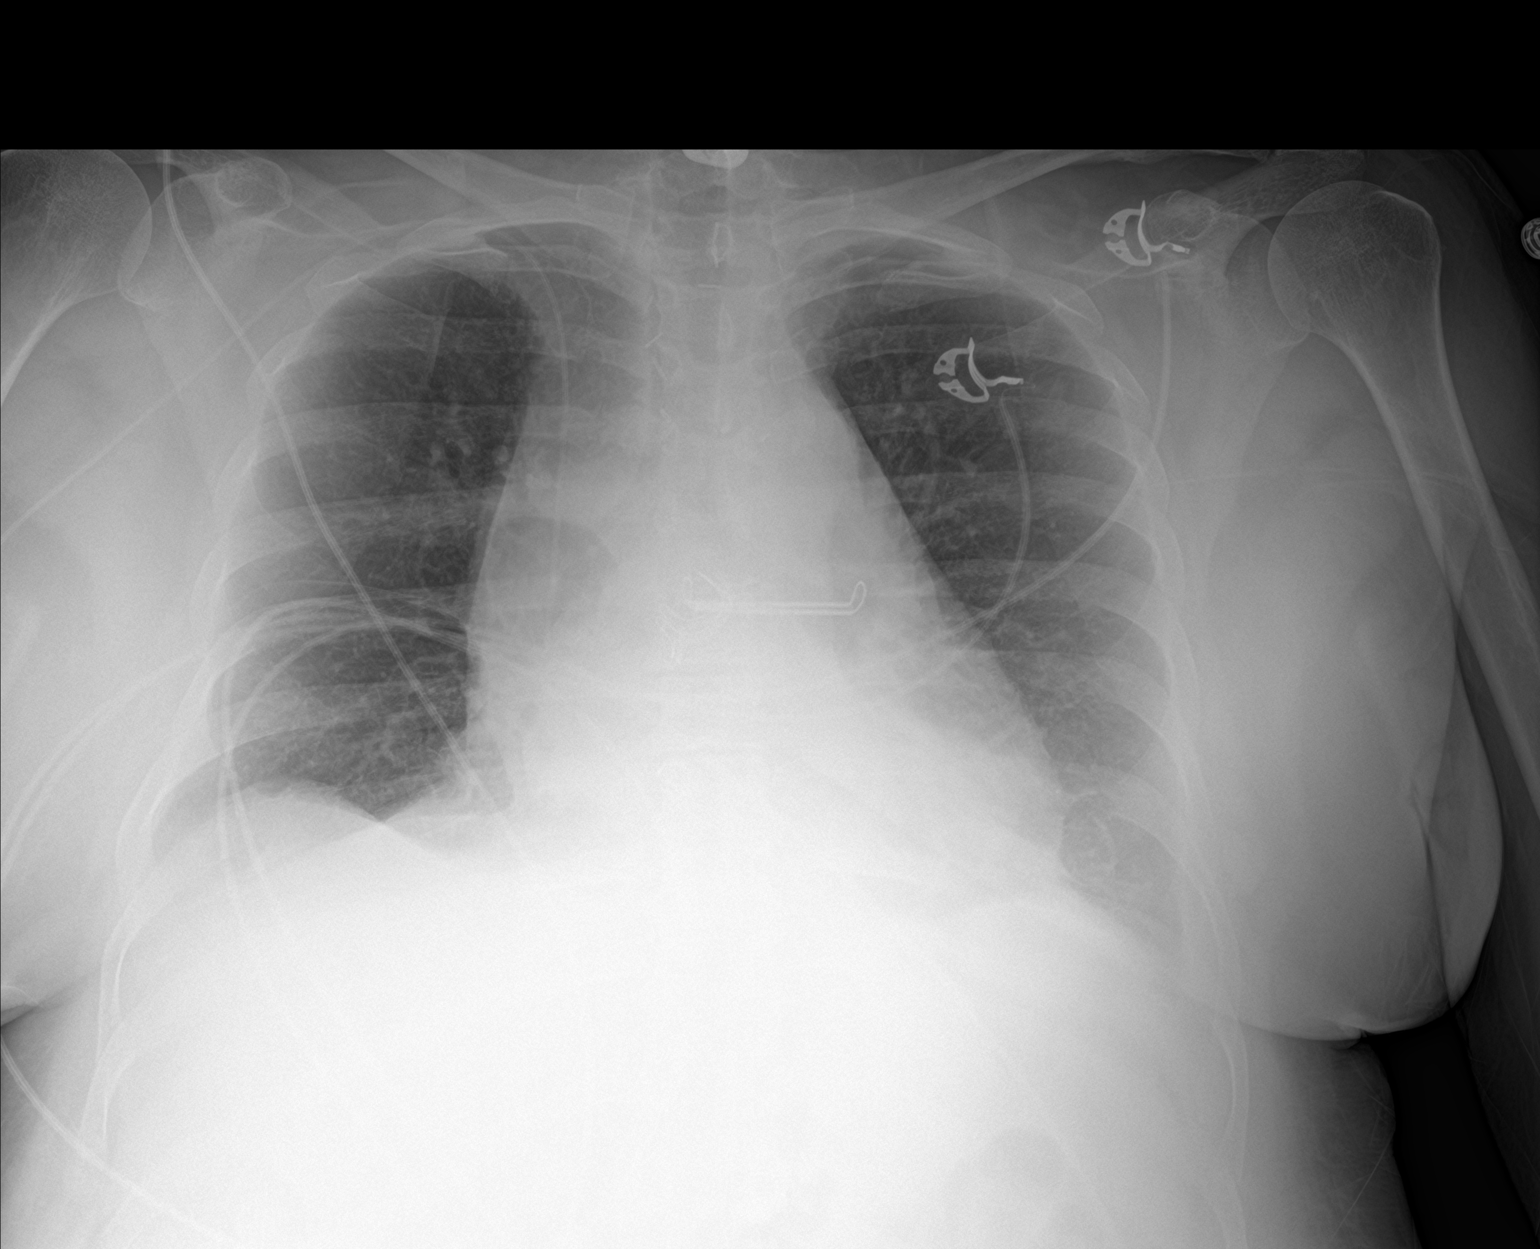

[chest lat]
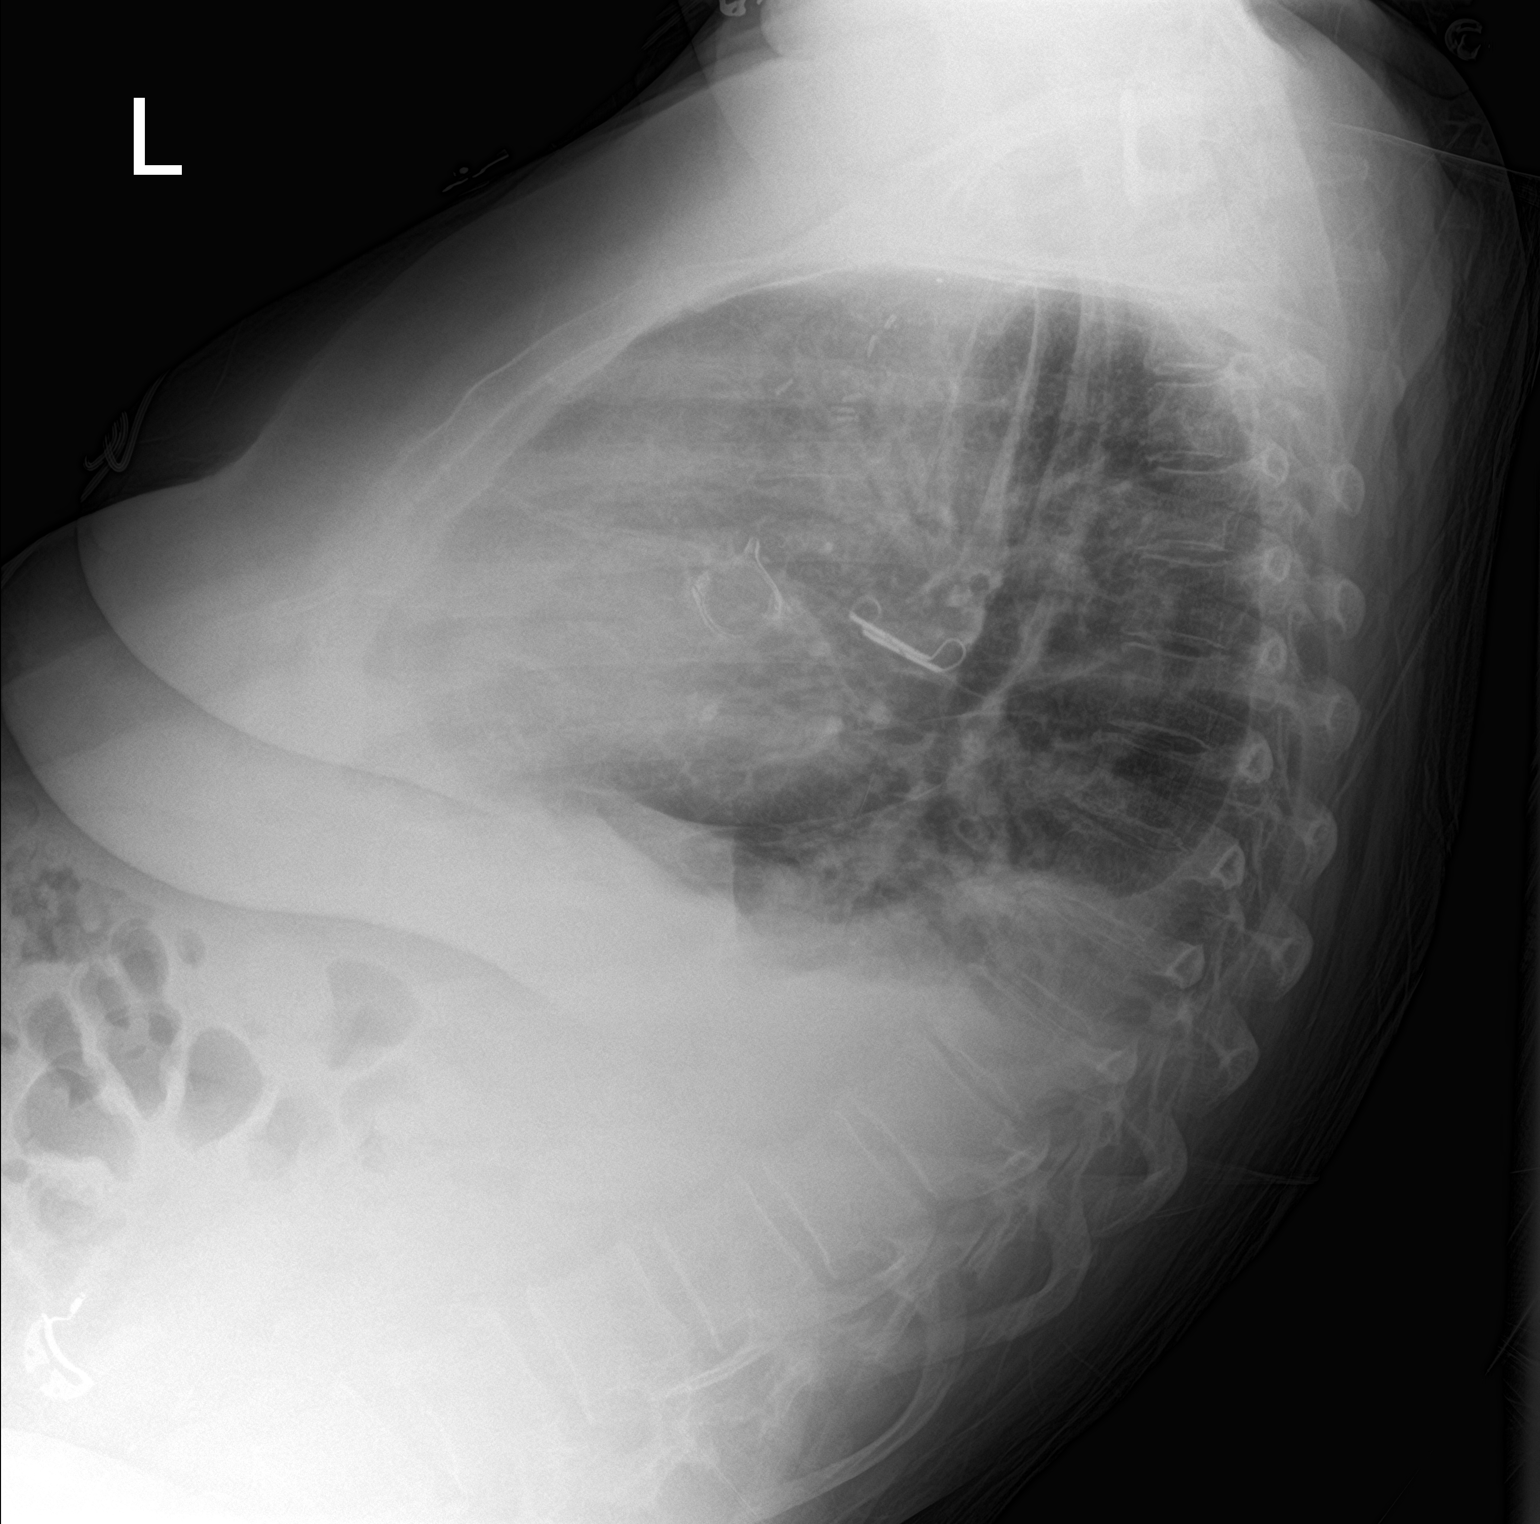

[2 of 2 positions shown; findings below may reference images not displayed]

FINDINGS: Patient is status post aortic valve replacement. There is a left
atrial appendage clamp. Central catheter tip is in the superior vena
cava. No pneumothorax. There is airspace opacity in the left base
with small left pleural effusion. Right lung is clear. Heart is
upper normal in size with pulmonary vascularity normal. No
adenopathy. There is postoperative change in the lower cervical
region.
IMPRESSION: Postoperative changes. Central catheter tip in superior vena cava.
No pneumothorax.

There is airspace opacity in the left base region which may be due
to atelectasis. A degree of superimposed pneumonia and/or aspiration
in this area cannot be excluded radiographically. Small left pleural
effusion. Right lung clear. Heart upper normal in size.

## 2019-11-19 IMAGING — CT CT HEAD W/O CM
3 of 4 series · 15 of 47 positions shown, 18 images · non-contrast
Comparison: [DATE]

CLINICAL DATA: Vision loss following recent surgery

EXAM:
CT HEAD WITHOUT CONTRAST
TECHNIQUE: Contiguous axial images were obtained from the base of the skull
through the vertex without intravenous contrast.

[Series 2: head 5.0 hc40 · axial · 0.40mm/px · z∈[+125,+245]mm · 9 of 29 slices shown, 12 images]
[im 3/29  brain]
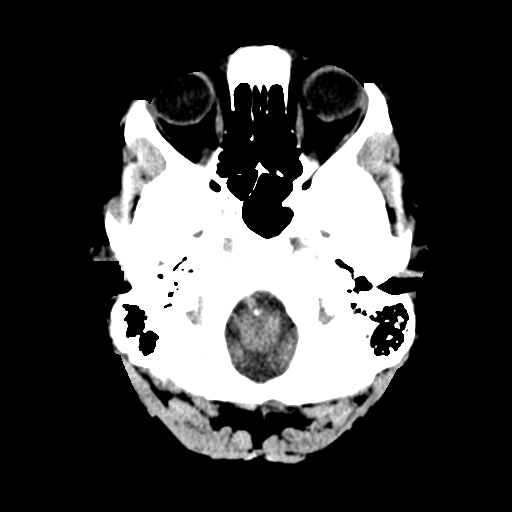
[im 3/29  bone]
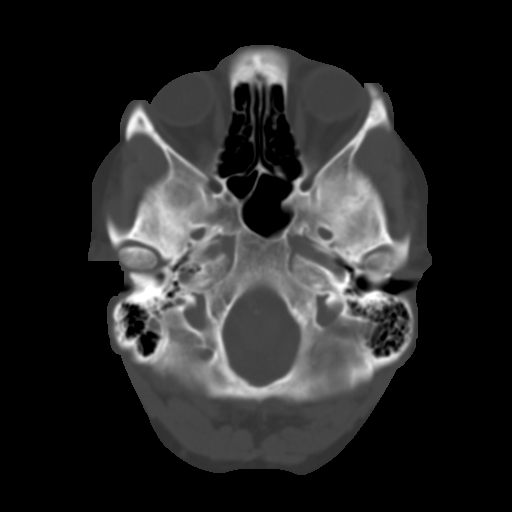
[im 7/29  brain]
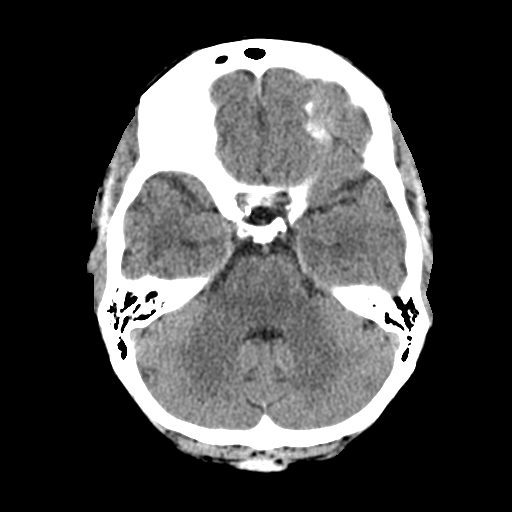
[im 9/29  brain]
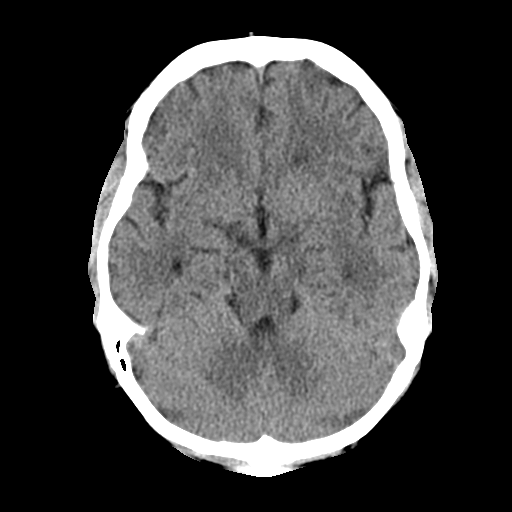
[im 13/29  brain]
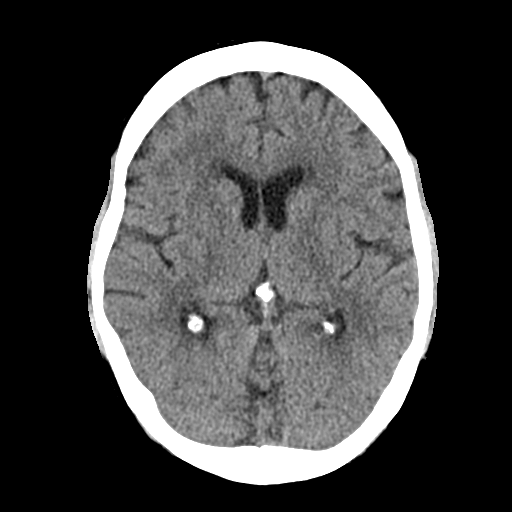
[im 15/29  brain]
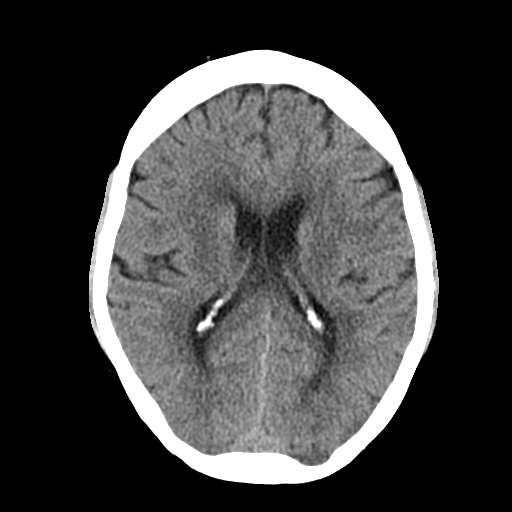
[im 15/29  bone]
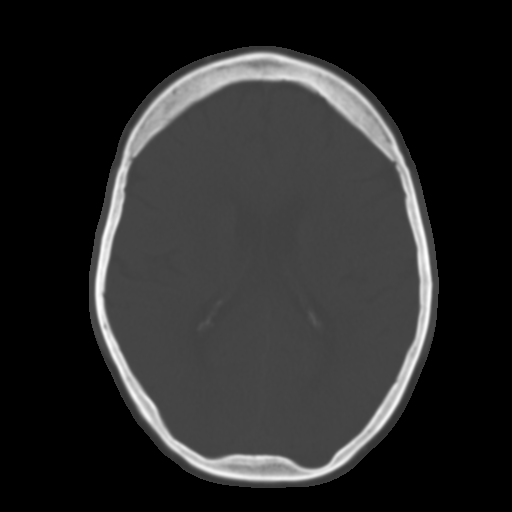
[im 17/29  brain]
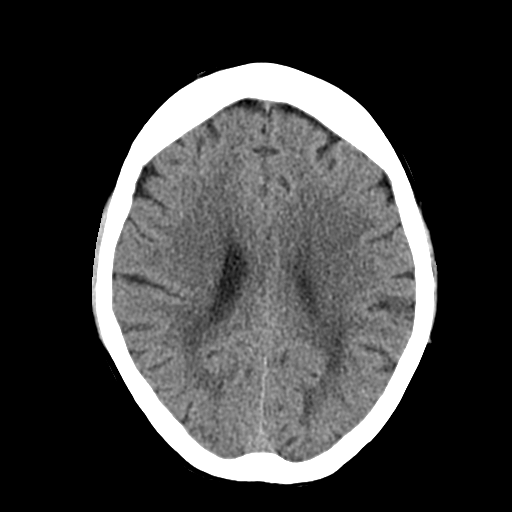
[im 21/29  brain]
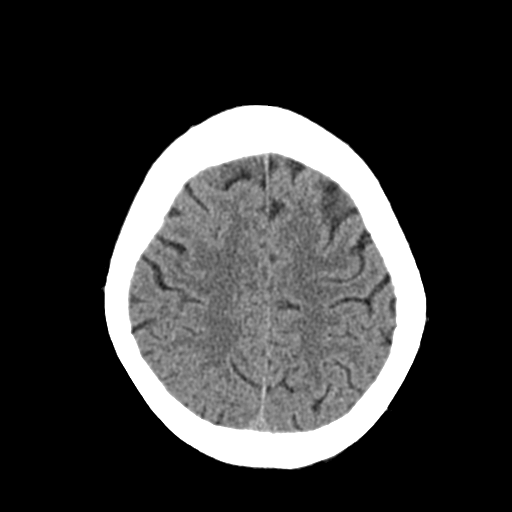
[im 23/29  brain]
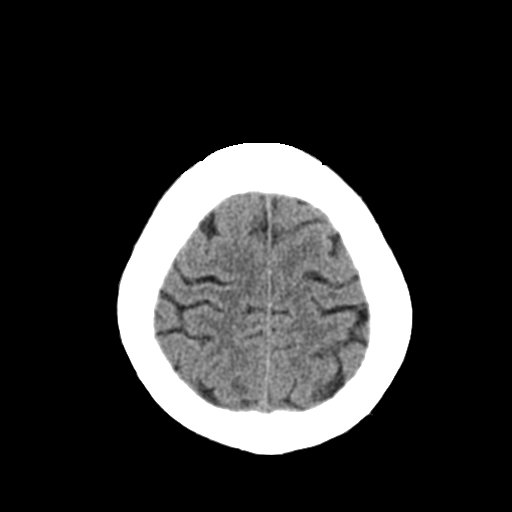
[im 27/29  brain]
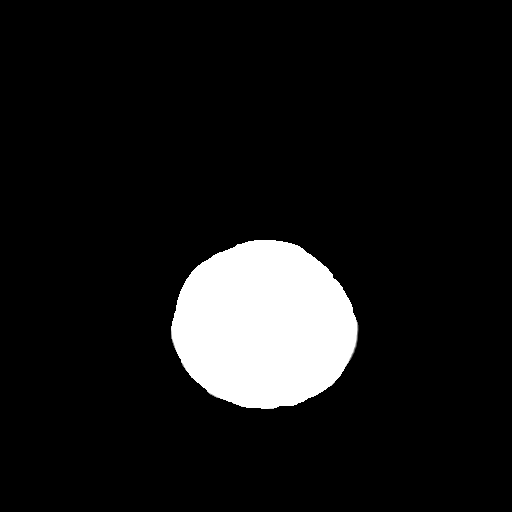
[im 27/29  bone]
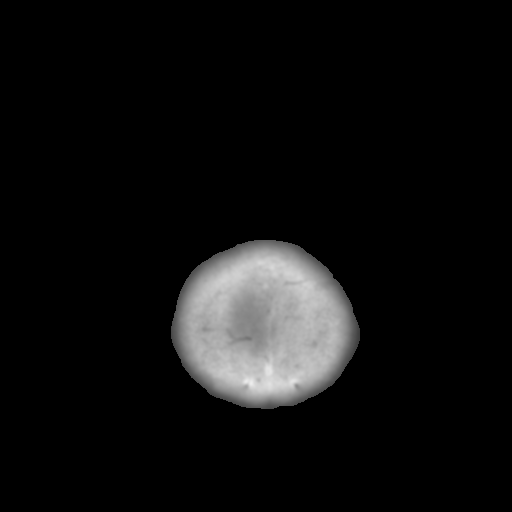

[Series 4: head 3.0 mpr coronal · coronal · 0.28mm/px · 3 of 62 slices shown]
[im 21/62  brain]
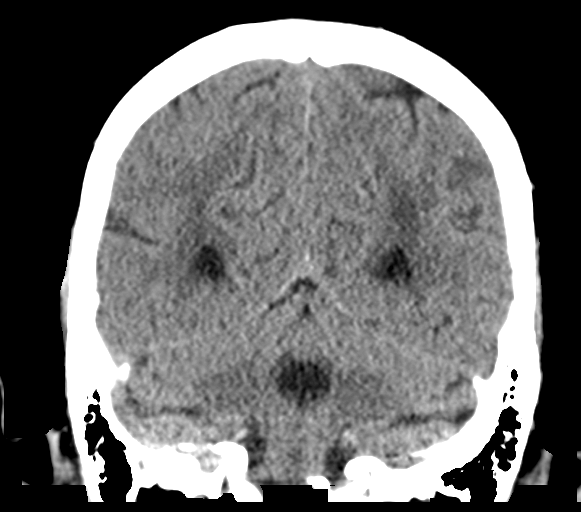
[im 28/62  brain]
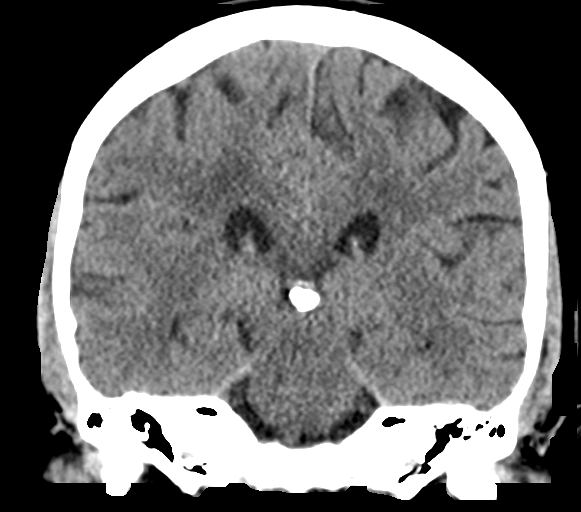
[im 34/62  brain]
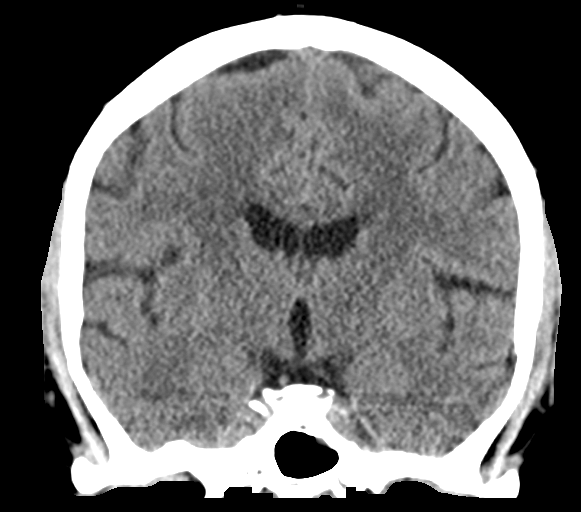

[Series 5: head 3.0 mpr sagittal · sagittal · 0.28mm/px · 3 of 52 slices shown]
[im 18/52  brain]
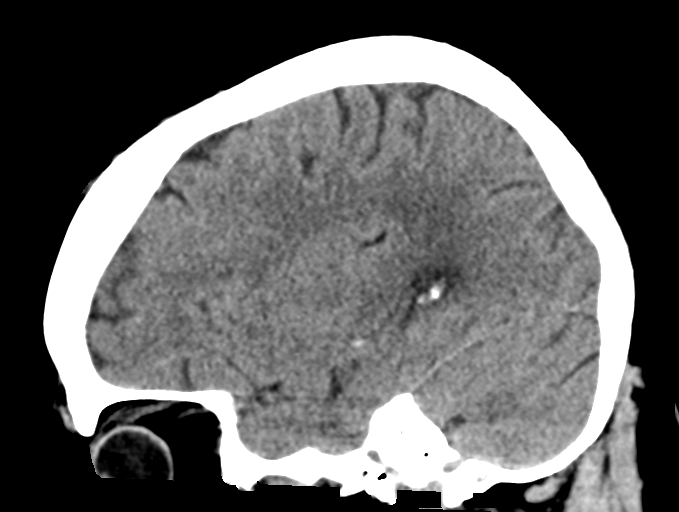
[im 26/52  brain]
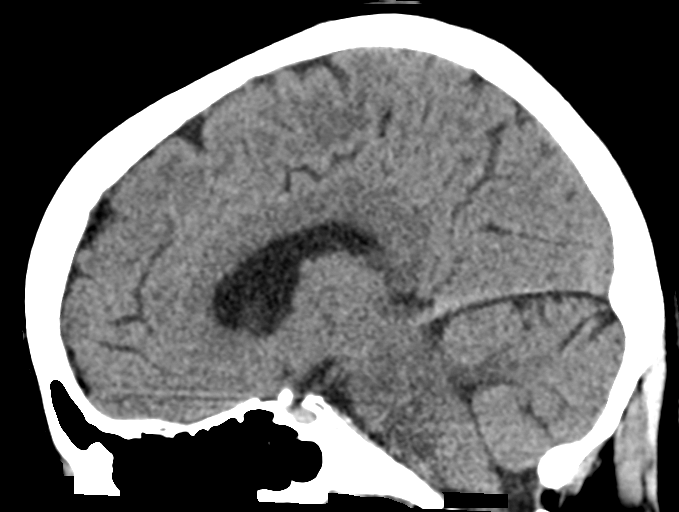
[im 35/52  brain]
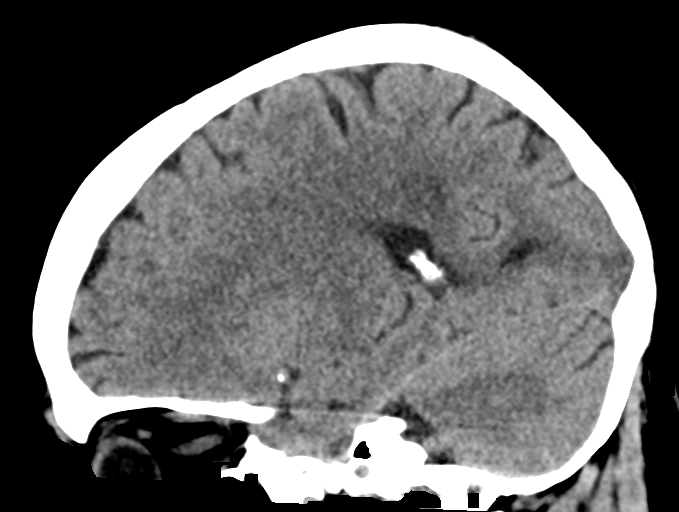

[15 of 47 positions shown; findings below may reference images not displayed]

FINDINGS: Brain: No findings to suggest acute hemorrhage, acute infarction or
space-occupying mass lesion are noted. Mild chronic white matter
ischemic changes noted.

Vascular: No hyperdense vessel or unexpected calcification.

Skull: Normal. Negative for fracture or focal lesion.

Sinuses/Orbits: No acute finding.

Other: None.
IMPRESSION: Mild chronic white matter ischemic changes are noted. No acute
abnormality seen.

## 2019-11-22 IMAGING — DX DG CHEST 2V
2 series · 2 of 2 positions shown · non-contrast
Comparison: PA and lateral chest [DATE].

CLINICAL DATA: Patient status post aortic valve replacement
[DATE].

EXAM:
CHEST - 2 VIEW

[dg chest 2 view (1 of 2)]
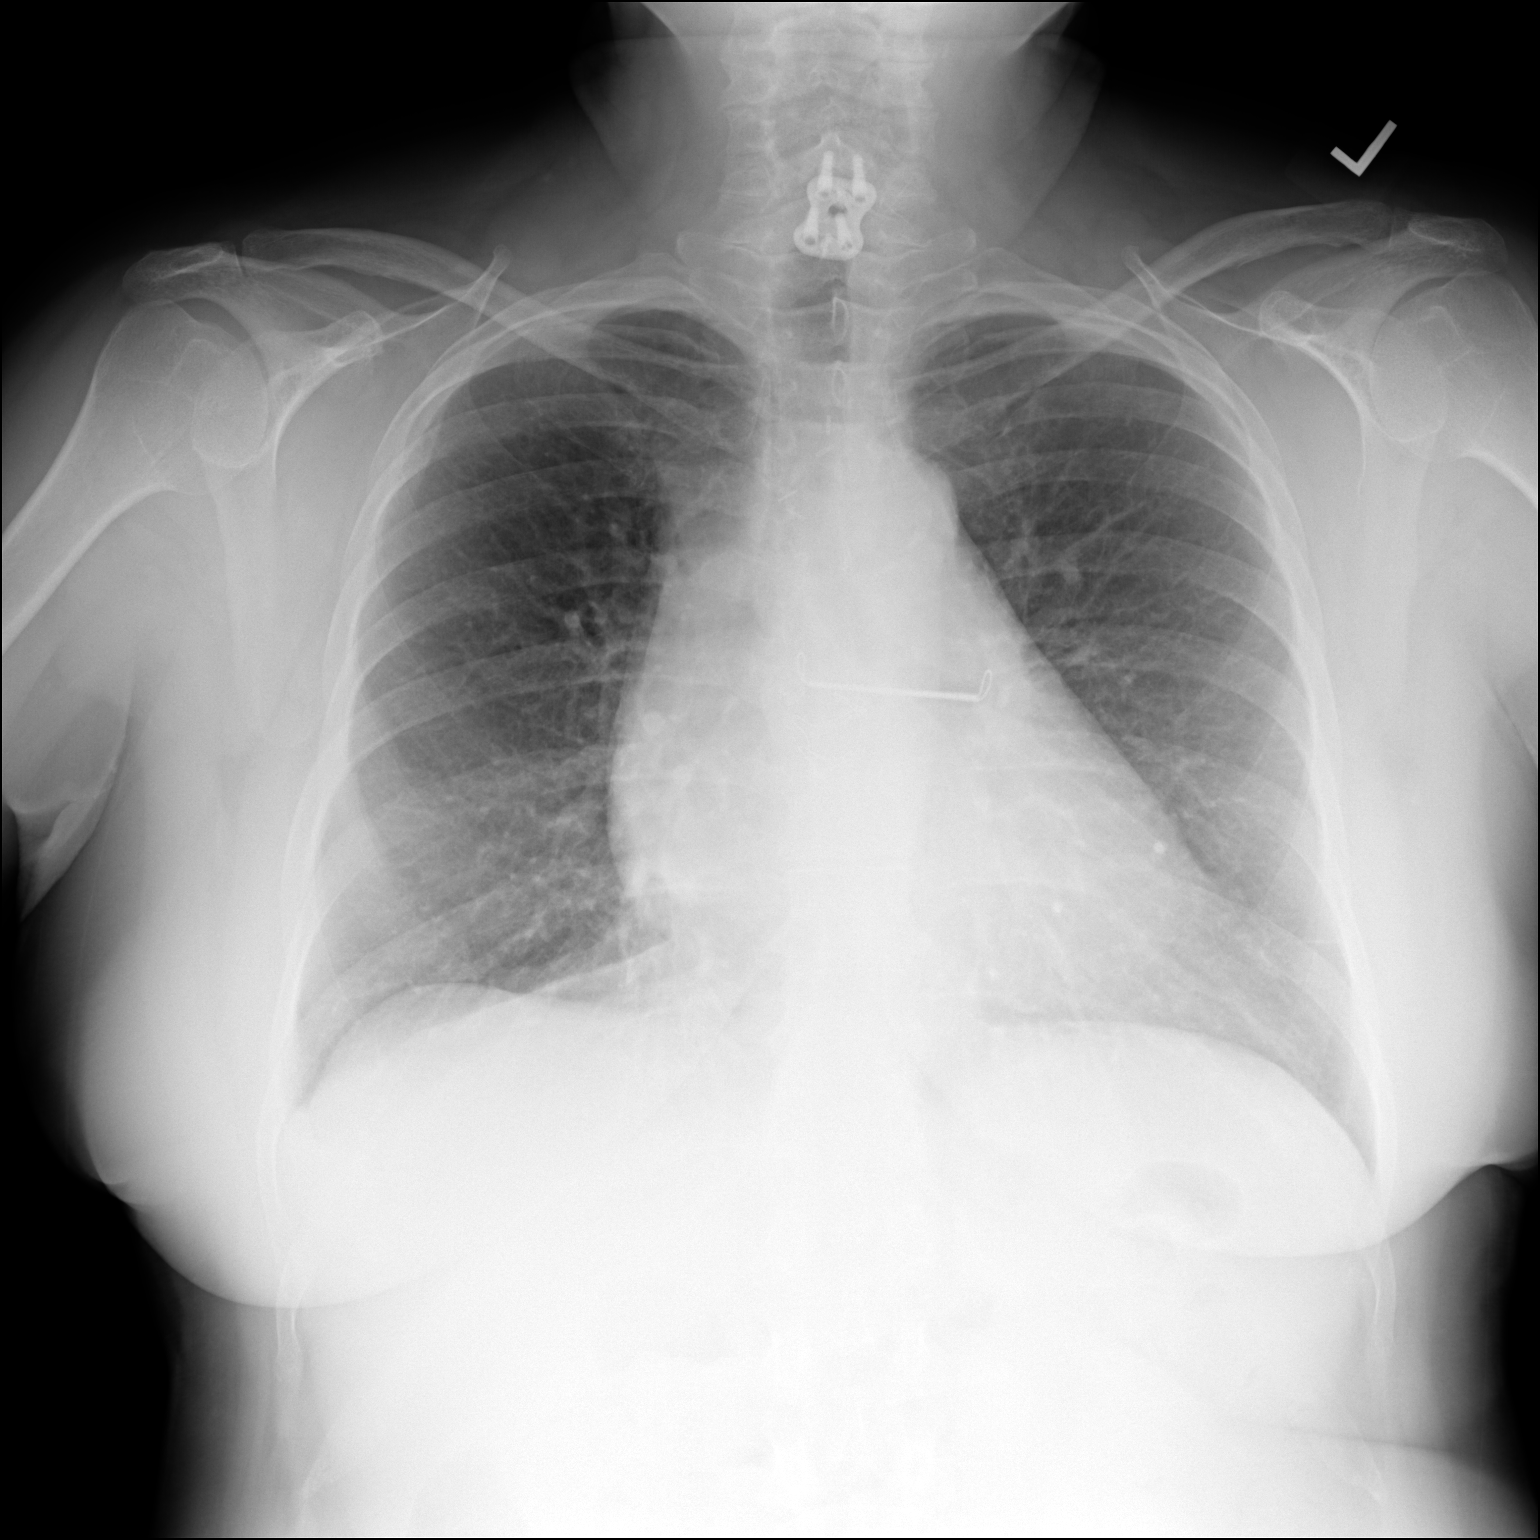

[dg chest 2 view (2 of 2)]
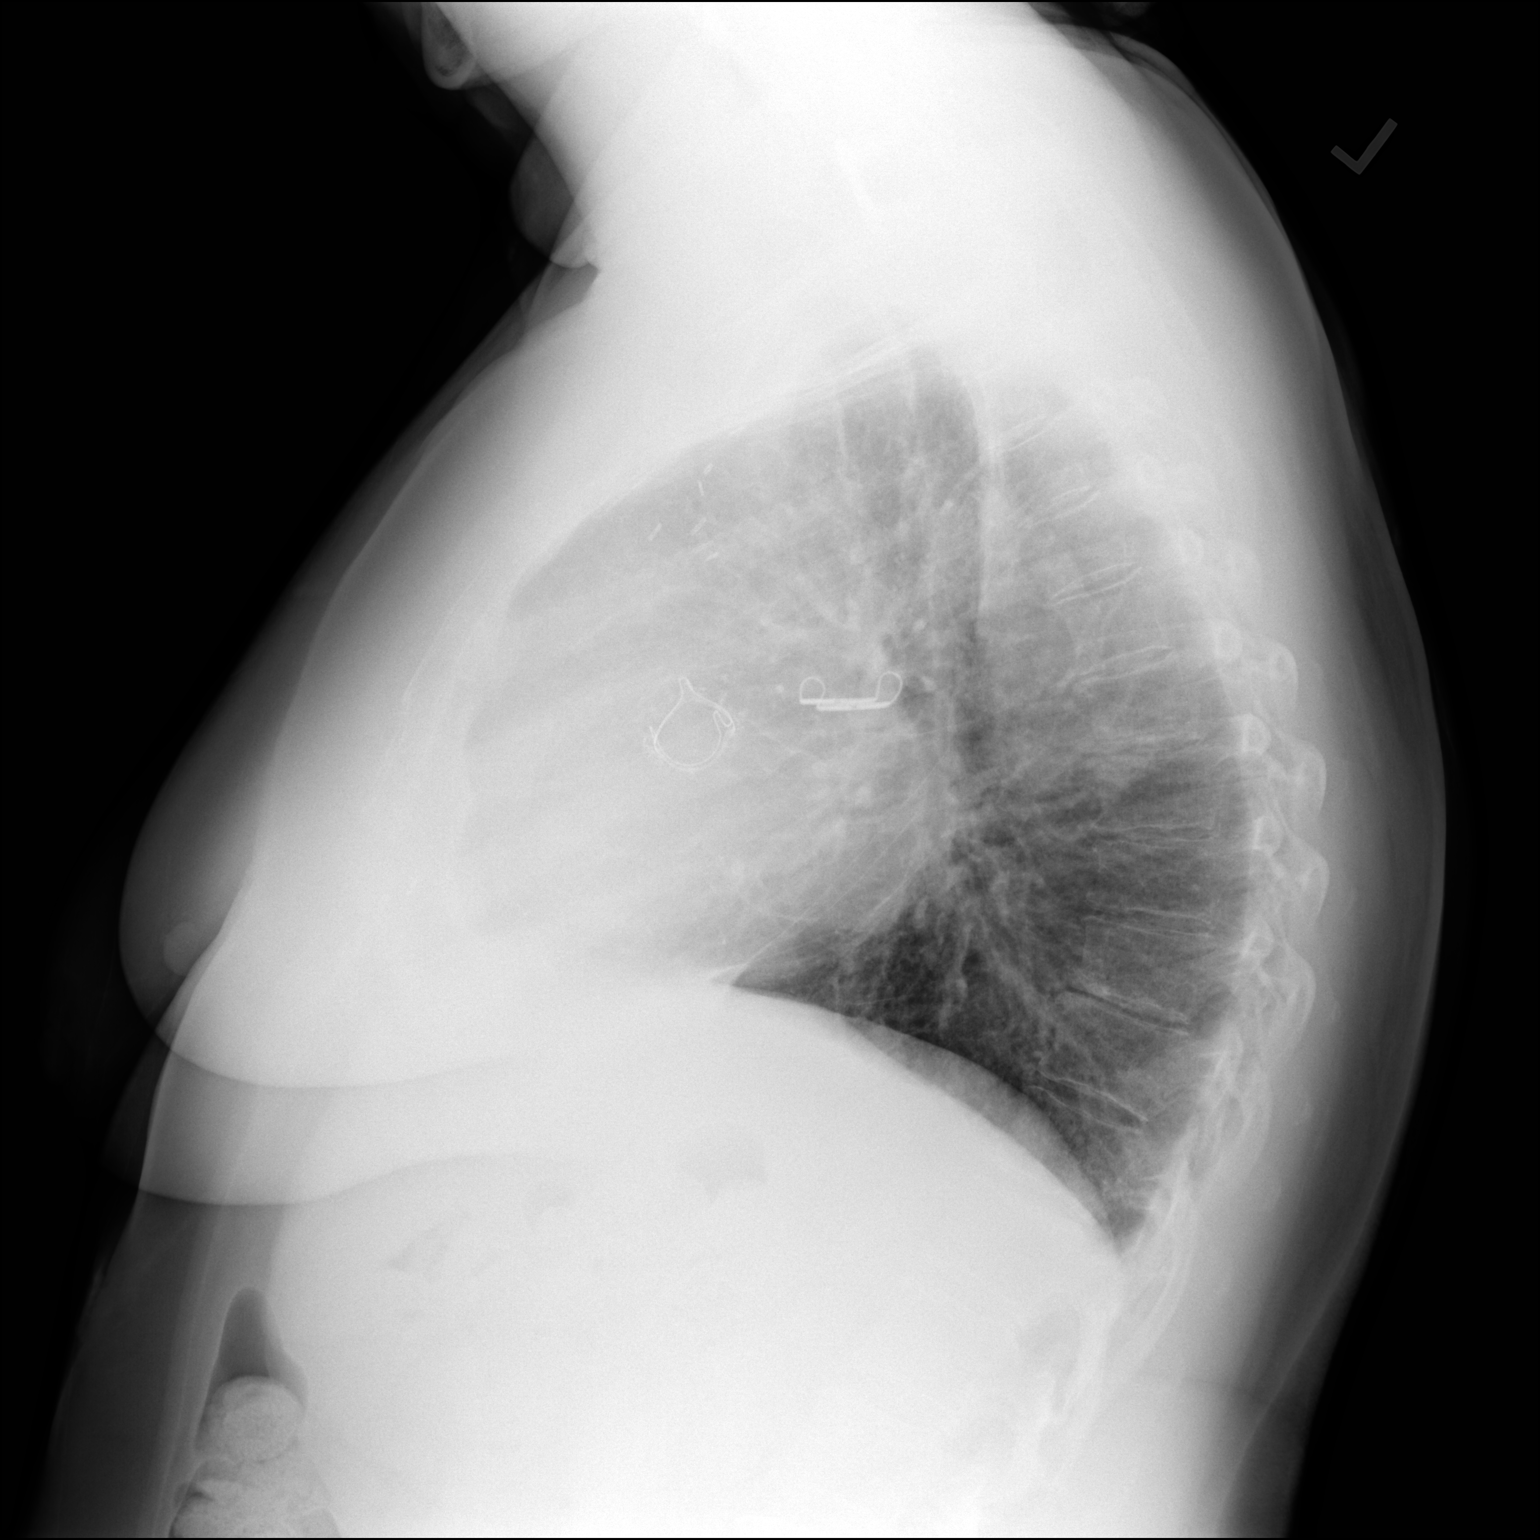

[2 of 2 positions shown; findings below may reference images not displayed]

FINDINGS: Prosthetic aortic valve and atrial appendage clip are again seen.
Surgical clips are also seen in the anterior aspect of the superior
mediastinum. Heart size is upper normal. Lungs are clear. No
pneumothorax or pleural fluid. Small pleural effusions seen on the
prior exam have resolved. No acute or focal bony abnormality.
IMPRESSION: No acute disease. Small pleural effusions seen on the prior exam
have resolved.

## 2020-05-08 IMAGING — CT CT ANGIO CHEST
2 of 7 series · 16 of 46 positions shown · IV contrast (omnipaque)
Comparison: March 14, 2022
COMPARISON: [DATE]

CLINICAL DATA: PE suspected.  Positive D-dimer.

EXAM:
CT ANGIOGRAPHY CHEST WITH CONTRAST
TECHNIQUE: Multidetector CT imaging of the chest was performed using the
standard protocol during bolus administration of intravenous
contrast. Multiplanar CT image reconstructions and MIPs were
obtained to evaluate the vascular anatomy.
CONTRAST:  80mL OMNIPAQUE IOHEXOL 350 MG/ML SOLN

[Series 6: thins · axial · 0.71mm/px · z∈[+1049,+1296]mm · 13 of 399 slices shown]
[im 23/399  lung]
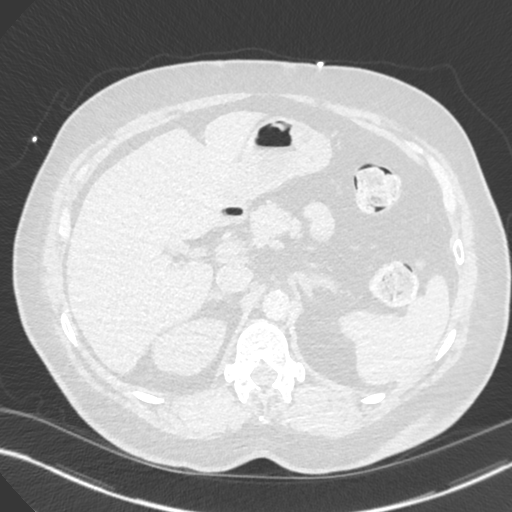
[im 45/399  soft-tissue]
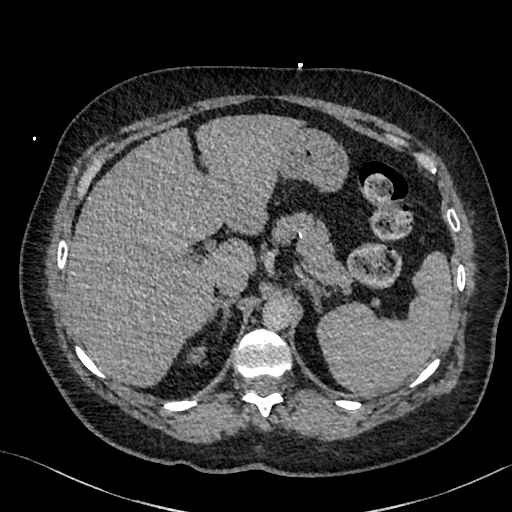
[im 89/399  lung]
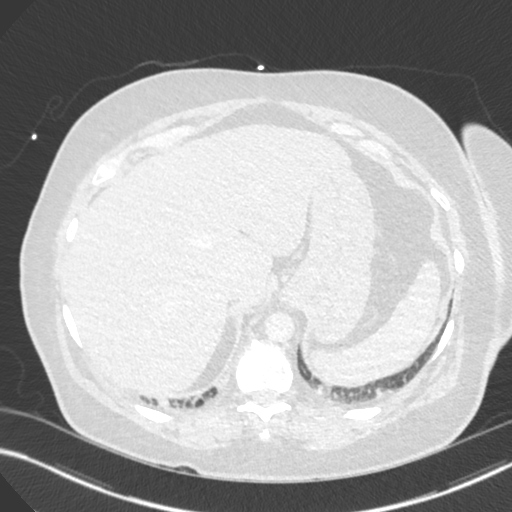
[im 111/399  soft-tissue]
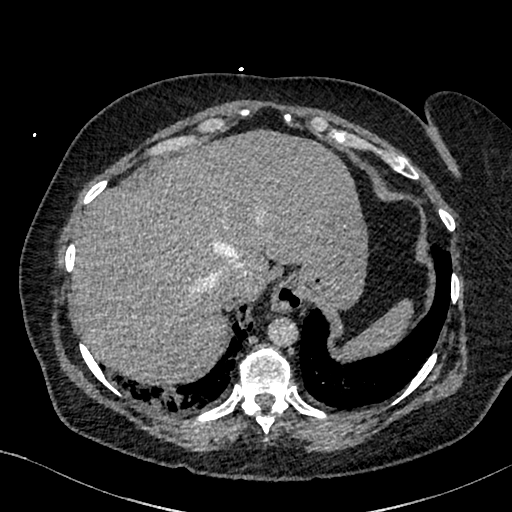
[im 133/399  lung]
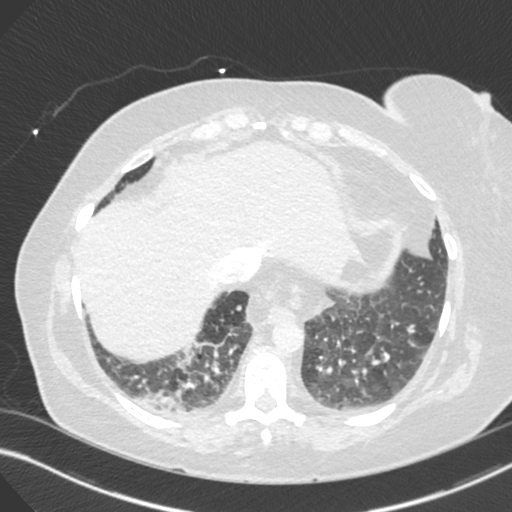
[im 177/399  soft-tissue]
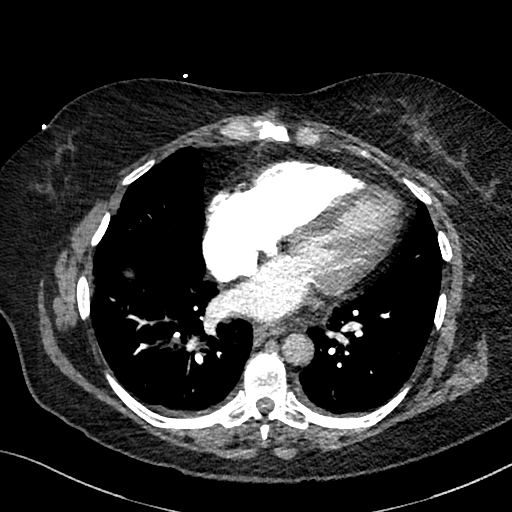
[im 200/399  lung]
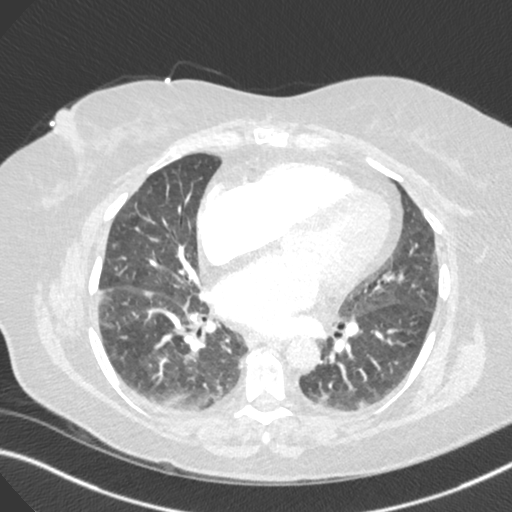
[im 222/399  soft-tissue]
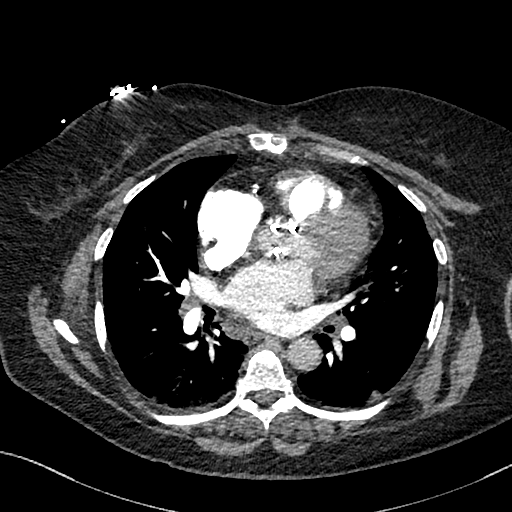
[im 266/399  lung]
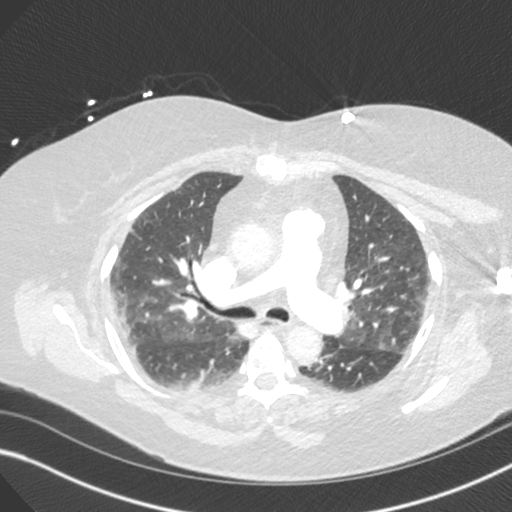
[im 288/399  soft-tissue]
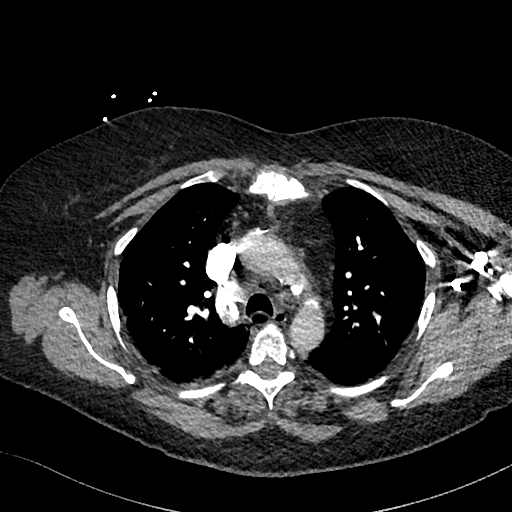
[im 310/399  lung]
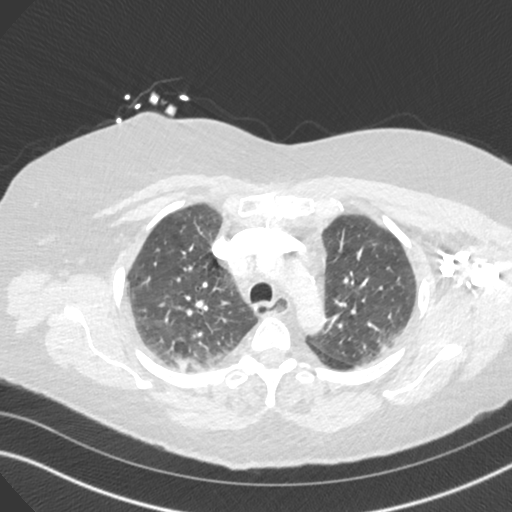
[im 354/399  soft-tissue]
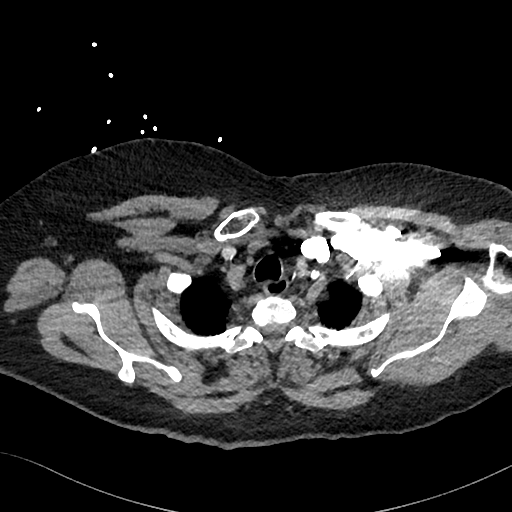
[im 376/399  lung]
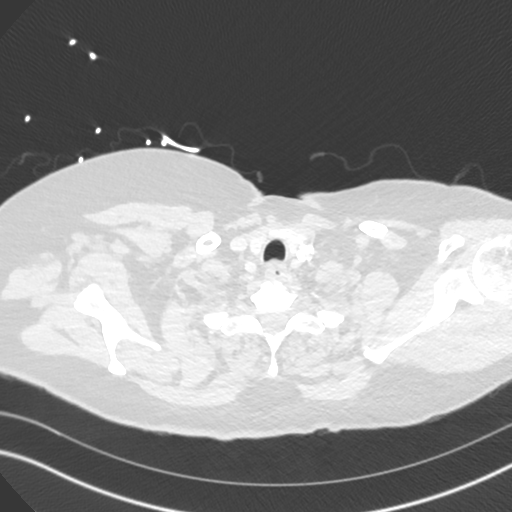

[Series 7: cor · coronal · 0.57mm/px · 3 of 151 slices shown]
[im 38/151  soft-tissue]
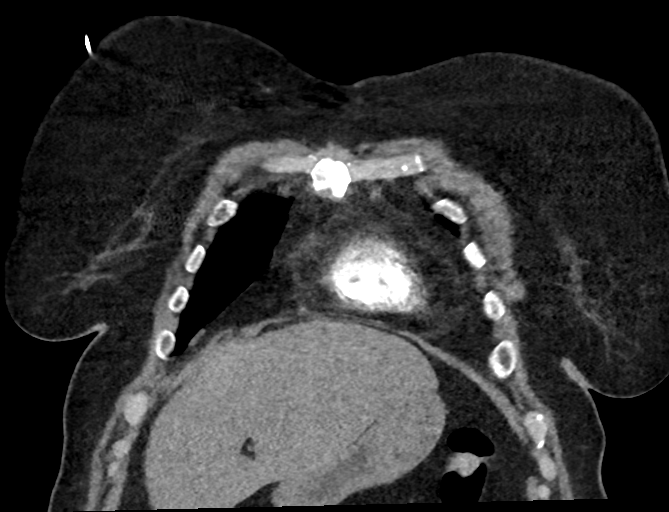
[im 76/151  soft-tissue]
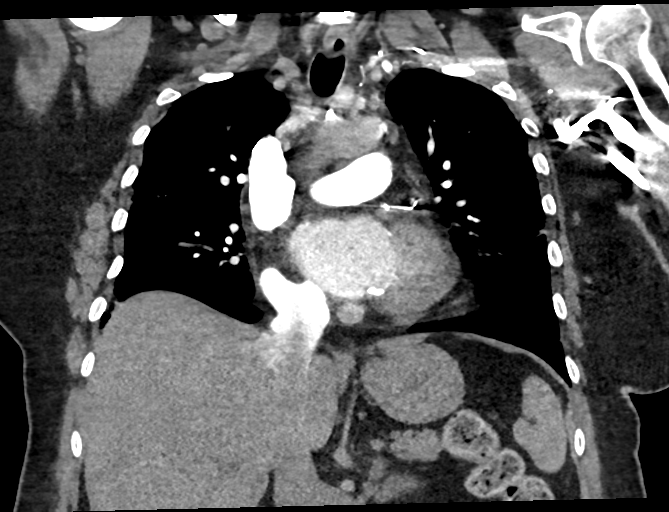
[im 113/151  soft-tissue]
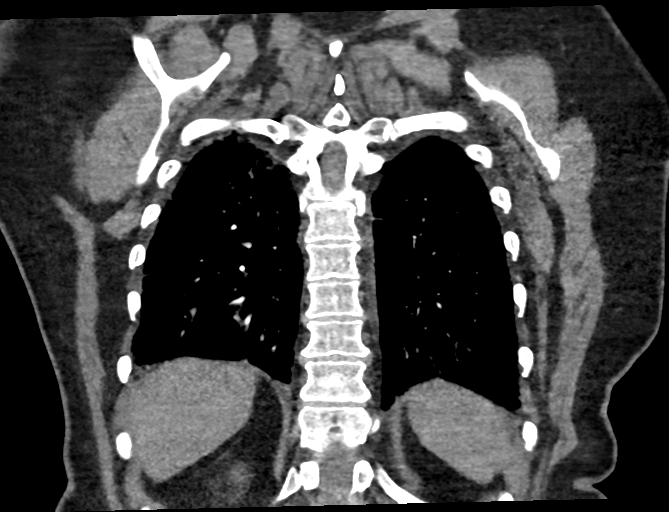

[16 of 46 positions shown; findings below may reference images not displayed]

FINDINGS: Cardiovascular: Contrast injection is sufficient to demonstrate
satisfactory opacification of the pulmonary arteries to the
segmental level. There is no pulmonary embolus or evidence of right
heart strain. The size of the main pulmonary artery is normal.
Normal heart size with coronary artery calcification. The course and
caliber of the aorta are normal. There is mild atherosclerotic
calcification. Opacification decreased due to pulmonary arterial
phase contrast bolus timing. The patient is status post prior median
sternotomy. There is mild reflux of contrast into the IVC.

Mediastinum/Nodes:

-- No mediastinal lymphadenopathy.

-- No hilar lymphadenopathy.

-- No axillary lymphadenopathy.

-- No supraclavicular lymphadenopathy.

-- Normal thyroid gland where visualized.

-  Unremarkable esophagus.

Lungs/Pleura: There are hazy bilateral airspace opacities. There is
some atelectasis at the lung bases. There is some interlobular
septal thickening primarily at the lung bases. There is mild
bronchial wall thickening and mucus plugging primarily in the right
lower lobe. There is no pneumothorax.

Upper Abdomen: Contrast bolus timing is not optimized for evaluation
of the abdominal organs. The visualized portions of the organs of
the upper abdomen are normal.

Musculoskeletal: No chest wall abnormality. No bony spinal canal
stenosis.

Review of the MIP images confirms the above findings.
IMPRESSION: 1. No acute pulmonary embolism.
2. Hazy bilateral airspace opacities are noted with some associated
interlobular septal thickening bilaterally. Findings can be seen in
patients with an atypical infectious process or interstitial
pulmonary edema.

Aortic Atherosclerosis ([id number]).
FINDINGS: Cardiovascular: Contrast injection is sufficient to demonstrate
satisfactory opacification of the pulmonary arteries to the
segmental level. There is no pulmonary embolus or evidence of right
heart strain. The size of the main pulmonary artery is normal.
Normal heart size with coronary artery calcification. The course and
caliber of the aorta are normal. There is mild atherosclerotic
calcification. Opacification decreased due to pulmonary arterial
phase contrast bolus timing. The patient is status post prior median
sternotomy. There is mild reflux of contrast into the IVC.

Mediastinum/Nodes:

-- No mediastinal lymphadenopathy.

-- No hilar lymphadenopathy.

-- No axillary lymphadenopathy.

-- No supraclavicular lymphadenopathy.

-- Normal thyroid gland where visualized.

-  Unremarkable esophagus.

Lungs/Pleura: There are hazy bilateral airspace opacities. There is
some atelectasis at the lung bases. There is some interlobular
septal thickening primarily at the lung bases. There is mild
bronchial wall thickening and mucus plugging primarily in the right
lower lobe. There is no pneumothorax.

Upper Abdomen: Contrast bolus timing is not optimized for evaluation
of the abdominal organs. The visualized portions of the organs of
the upper abdomen are normal.

Musculoskeletal: No chest wall abnormality. No bony spinal canal
stenosis.

Review of the MIP images confirms the above findings.
IMPRESSION: 1. No acute pulmonary embolism.
2. Hazy bilateral airspace opacities are noted with some associated
interlobular septal thickening bilaterally. Findings can be seen in
patients with an atypical infectious process or interstitial
pulmonary edema.

Aortic Atherosclerosis ([XZ]-[XZ]).

## 2020-05-08 IMAGING — DX DG CHEST 1V PORT
1 series · 1 of 1 positions shown · non-contrast
Comparison: [DATE].

CLINICAL DATA: Atrial fibrillation.

EXAM:
PORTABLE CHEST 1 VIEW

[chest ap]
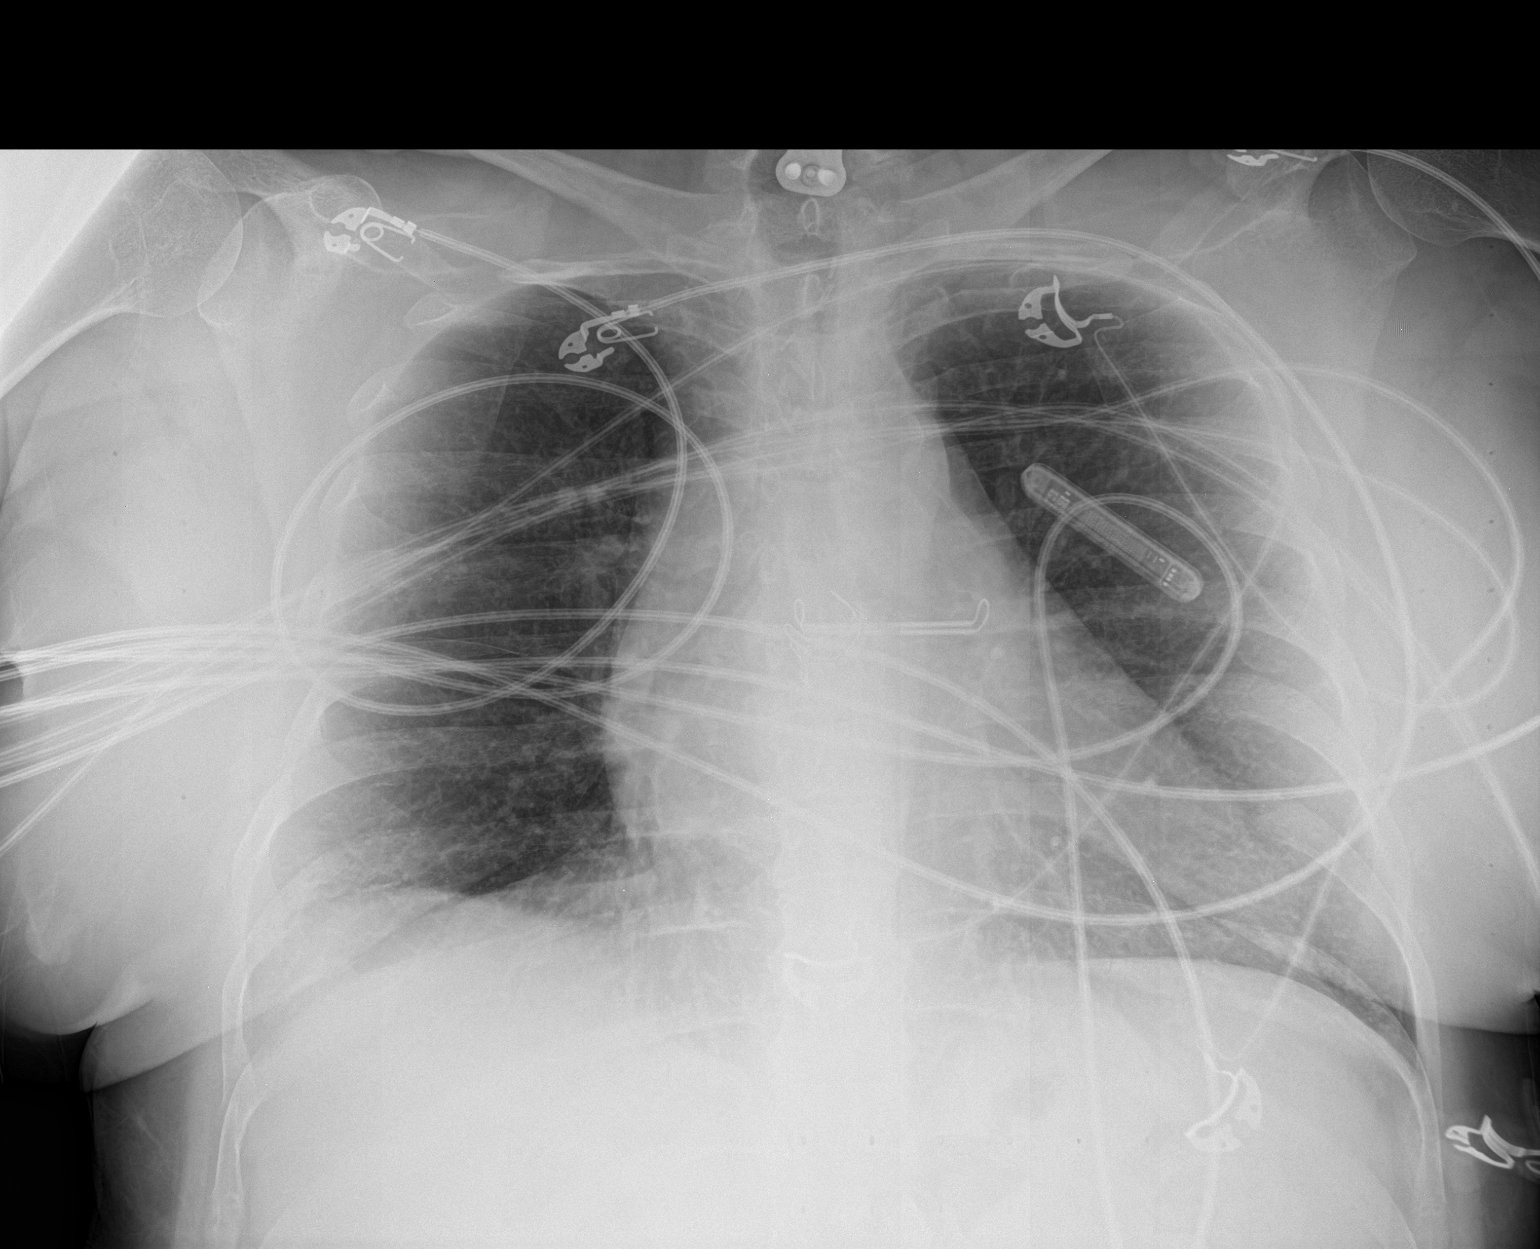

[1 of 1 positions shown; findings below may reference images not displayed]

FINDINGS: Stable cardiomediastinal silhouette. Status post aortic valve
repair. No pneumothorax or pleural effusion is noted. Both lungs are
clear. The visualized skeletal structures are unremarkable.
IMPRESSION: No active disease.

## 2020-05-09 MED FILL — AMIODARONE HCL 200 MG TAB: 200 | 30 days supply | Qty: 50 | Fill #0

## 2020-06-13 IMAGING — US US ABDOMEN LIMITED RUQ/ASCITES
1 series · 13 of 25 positions shown · non-contrast
Comparison: CT angiography from [DATE] and from [DATE].

CLINICAL DATA: RIGHT upper quadrant and epigastric pain, history of
endocarditis in this 64-year-old female.

EXAM:
ULTRASOUND ABDOMEN LIMITED RIGHT UPPER QUADRANT

[Series 1: us abdomen limited ruq/ascites · 0.25mm/px · 13 of 73 slices shown]
[im 1/73]
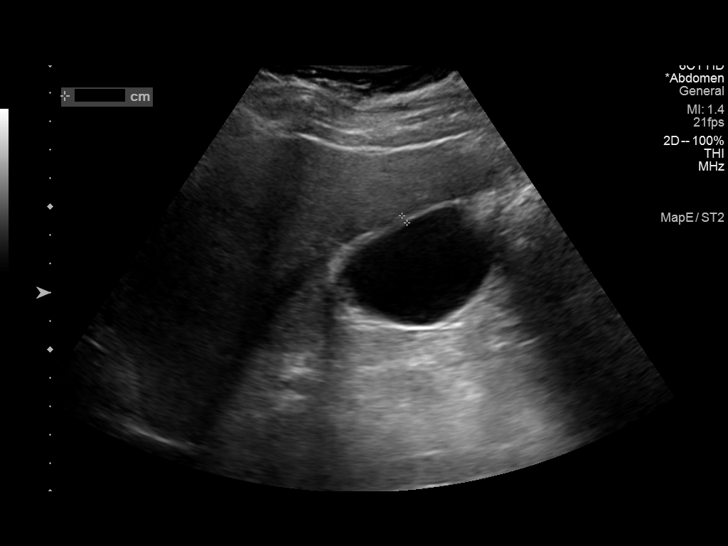
[im 7/73]
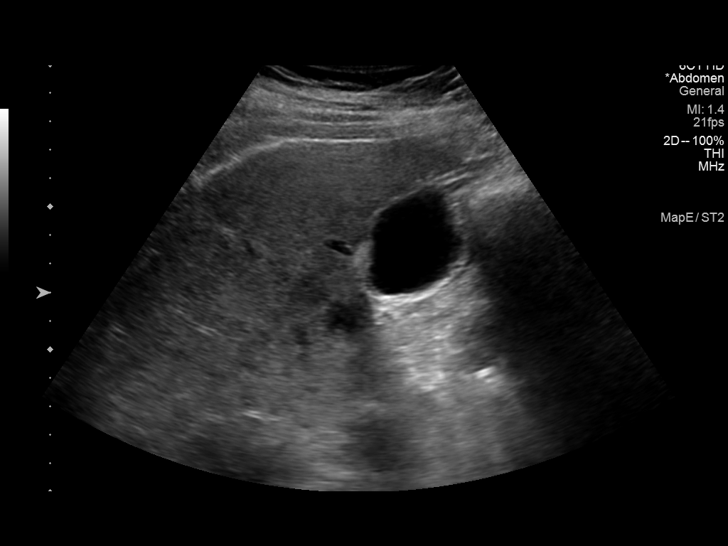
[im 13/73]
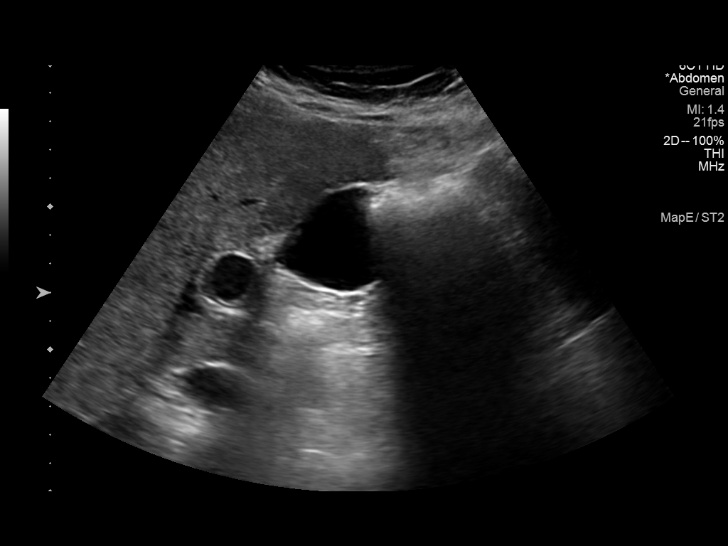
[im 19/73]
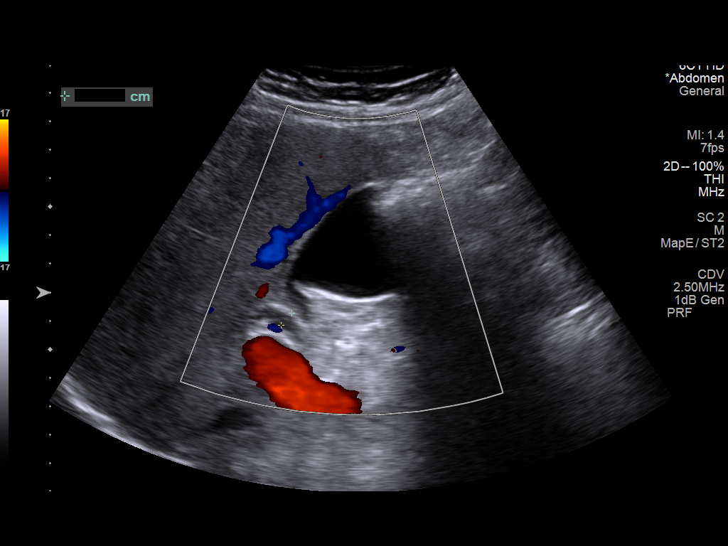
[im 25/73]
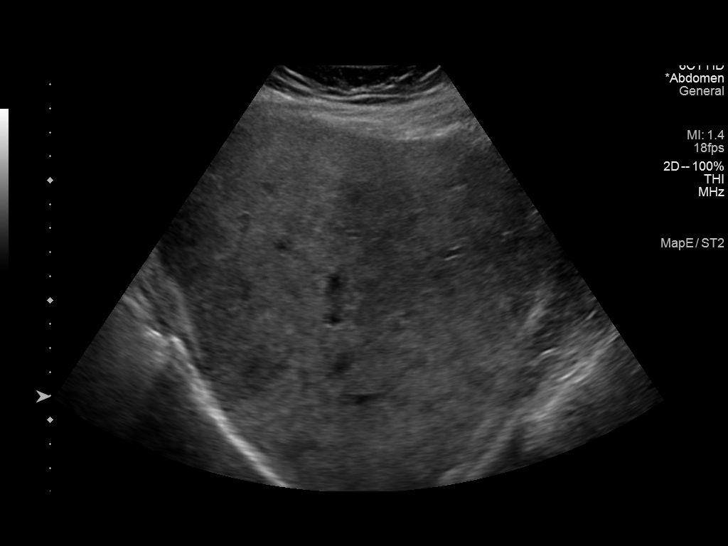
[im 31/73]
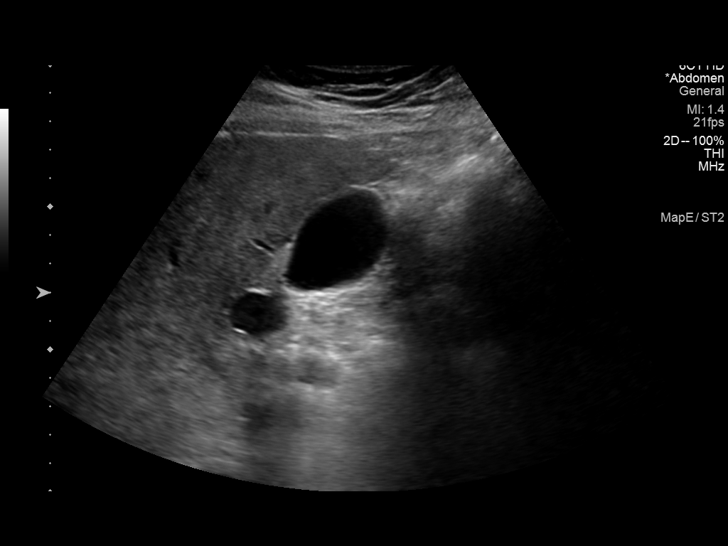
[im 37/73]
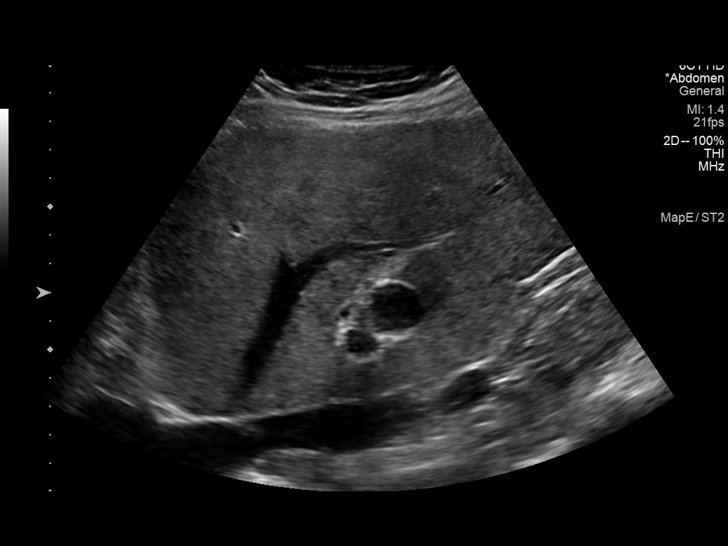
[im 43/73]
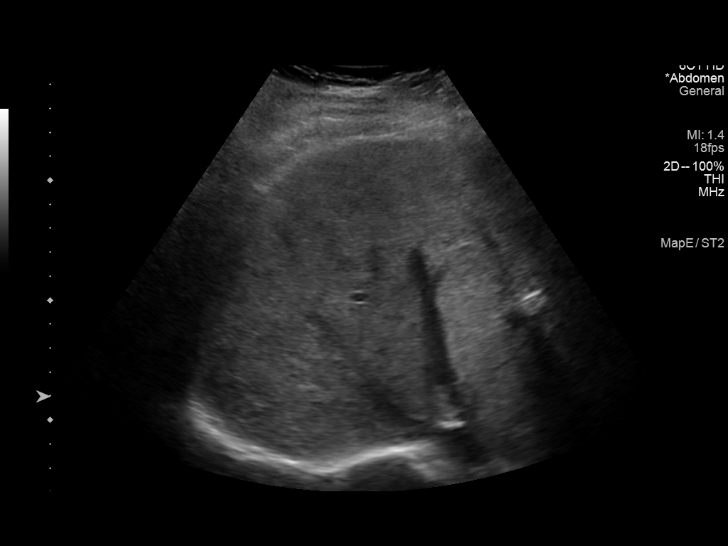
[im 49/73]
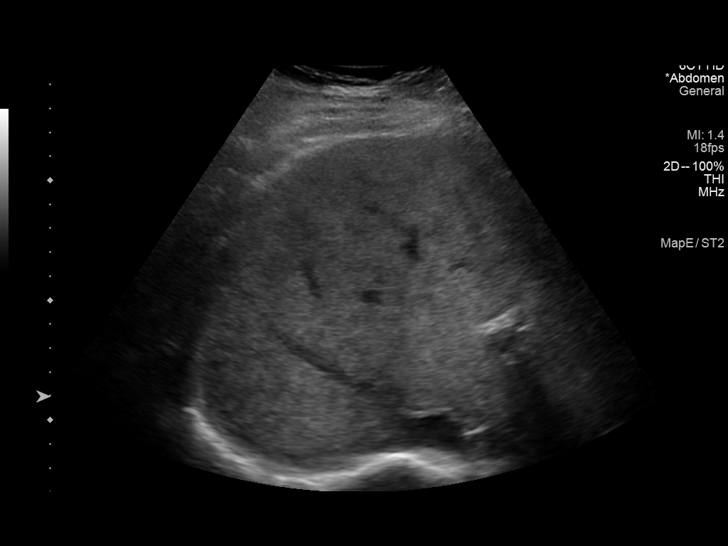
[im 55/73]
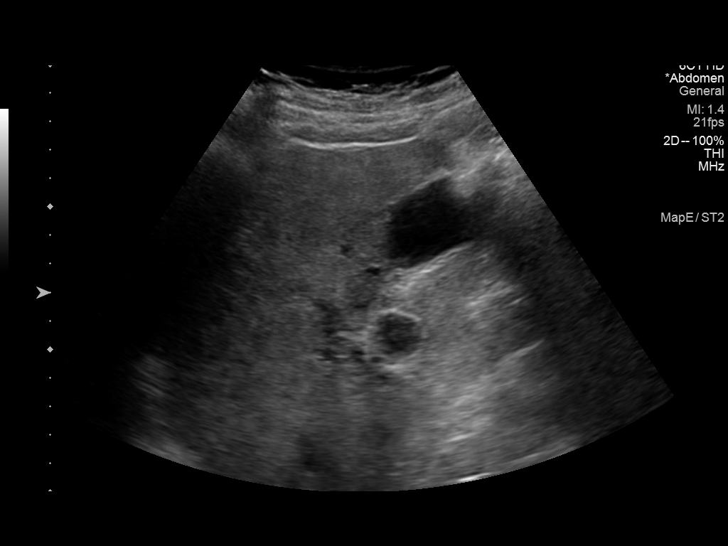
[im 61/73]
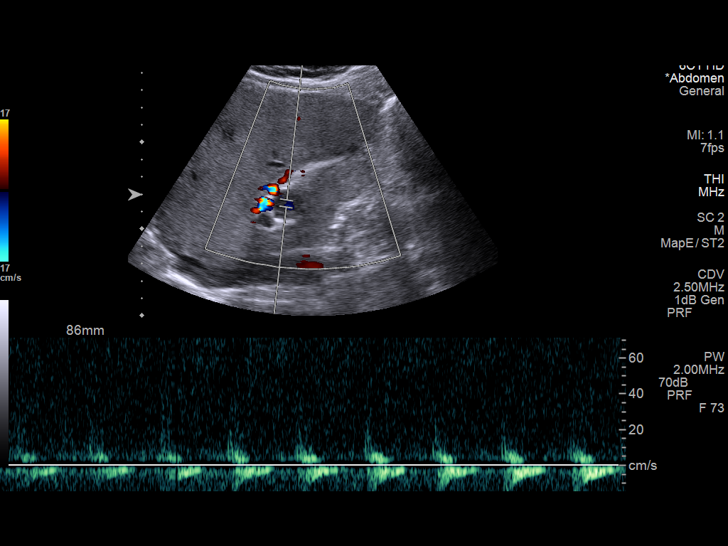
[im 67/73]
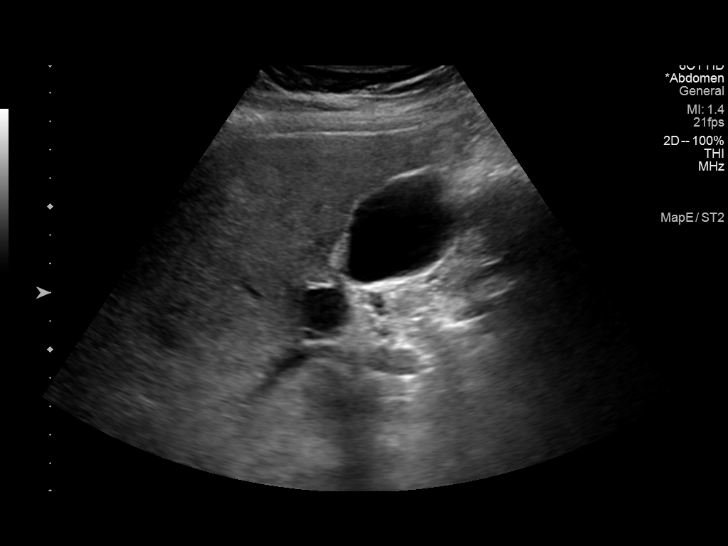
[im 73/73]
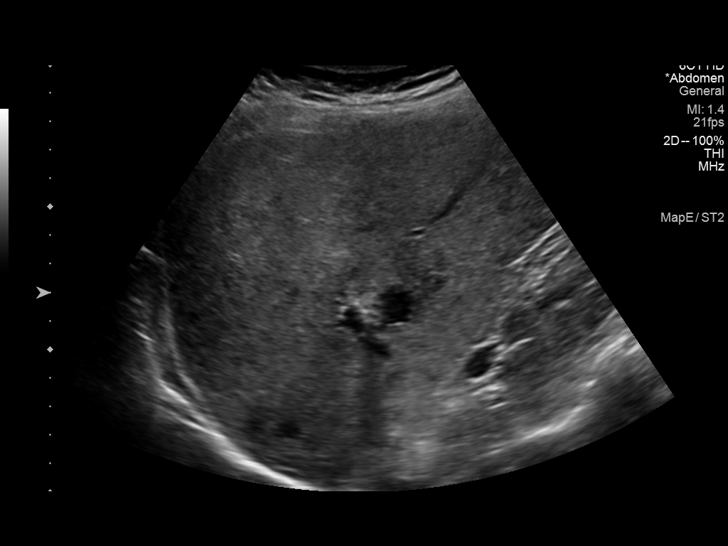

[13 of 25 positions shown; findings below may reference images not displayed]

FINDINGS: Gallbladder:

No gallstones or wall thickening visualized. No sonographic Murphy
sign noted by sonographer.

Common bile duct:

Diameter: 6 mm

Liver:

Heterogeneous echotexture with generalized increase in echogenicity.
Portal vein is patent. There is however a spherical structure with
peripheral increased echogenicity and "KHOJA appearance" adjacent
to the vascular structures at the hepatic hilum, this shows color
Doppler flow and spectral Doppler flow, and some images it does not
appeara to fill in entirely, on 1 image it displays KHOJA "KHOJA"
appearance. This is seen on image 33 of 74. Some peripheral
turbulent flow is noted on other images.

This measures 1.9 x 1.9 x 1.7 cm.

Other: No ascites
IMPRESSION: Findings that are suspicious for vascular abnormality such as
pseudoaneurysm perhaps arising from hepatic artery or portal vein in
this patient with history of endocarditis. This may be partially
thrombosed given appearance. Would suggest immediate evaluation with
CT angiography with both arterial and venous phase for further
assessment.

Top-normal size of the common bile duct without definitive signs of
acute biliary pathology. Would suggest attention to this area on
upcoming CT evaluation. Perhaps biliary prominence is related to
local compression.

Signs of steatosis and liver disease.

These results will be called to the ordering clinician or
representative by the Radiologist Assistant, and communication
documented in the PACS or [REDACTED].

## 2020-06-14 IMAGING — CT CT ANGIO ABDOMEN
3 of 14 series · 10 of 46 positions shown, 16 images · IV contrast (omnipaque)
Comparison: [DATE].

CLINICAL DATA: Abdominal pain, suspicion for vascular abnormality
on abdominal sonogram.

EXAM:
CT ANGIOGRAPHY ABDOMEN
TECHNIQUE: Multidetector CT imaging of the abdomen was performed using the
standard protocol during bolus administration of intravenous
contrast. Multiplanar reconstructed images and MIPs were obtained
and reviewed to evaluate the vascular anatomy.
CONTRAST:  100mL OMNIPAQUE IOHEXOL 350 MG/ML SOLN

[Series 6: axial st · axial · 0.91mm/px · z∈[-805,-700]mm · 2 of 64 slices shown]
[im 22/64  soft-tissue]
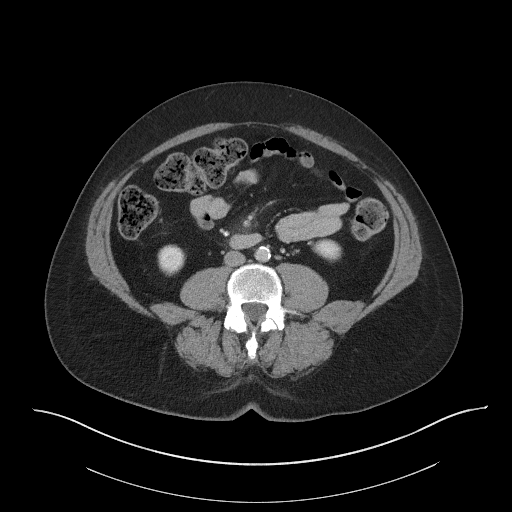
[im 43/64  soft-tissue]
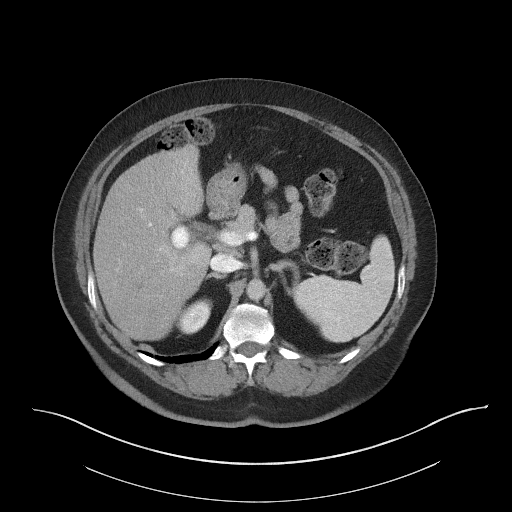

[Series 8: axial venous · axial · portal-venous · 0.72mm/px · z∈[-824,-628]mm · 7 of 132 slices shown, 12 images]
[im 17/132  soft-tissue]
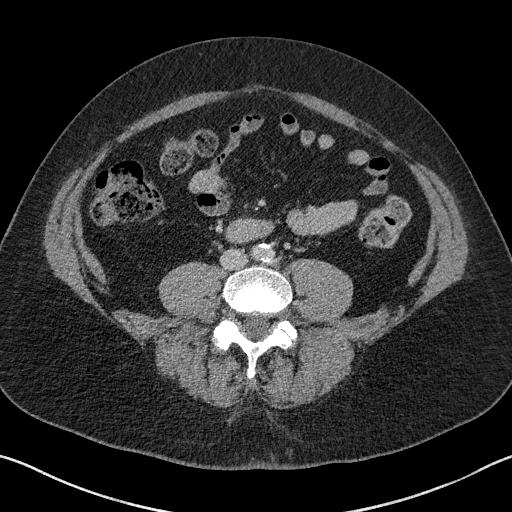
[im 17/132  bone]
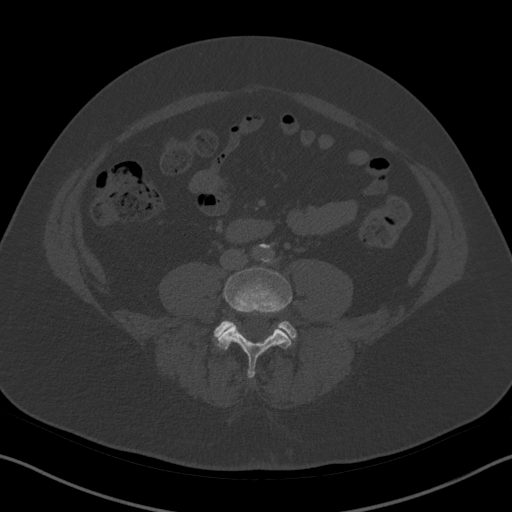
[im 33/132  soft-tissue]
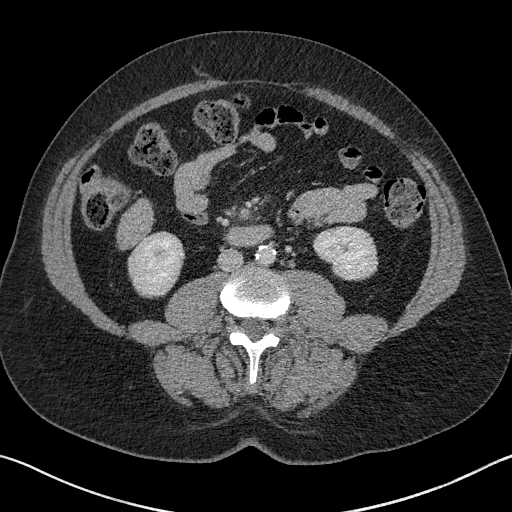
[im 50/132  soft-tissue]
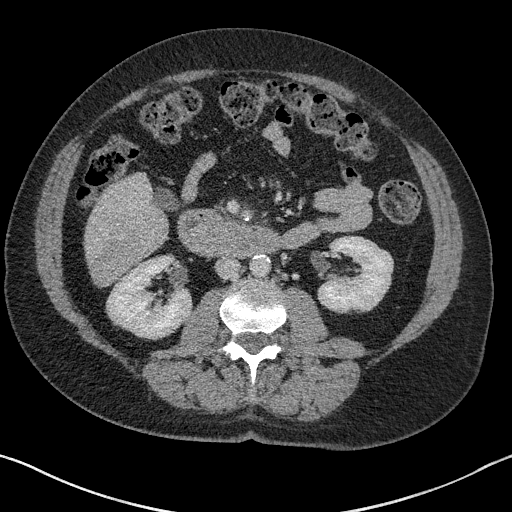
[im 66/132  soft-tissue]
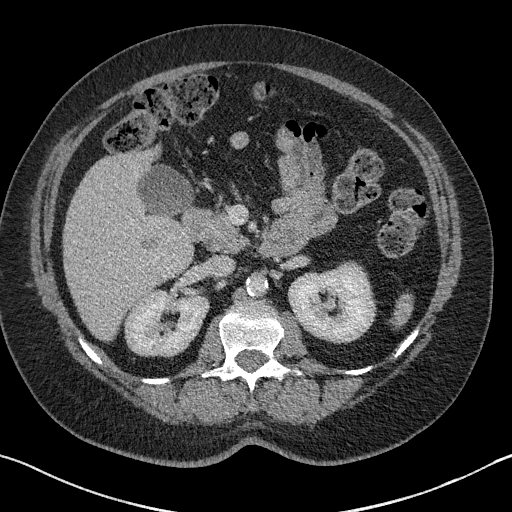
[im 66/132  lung]
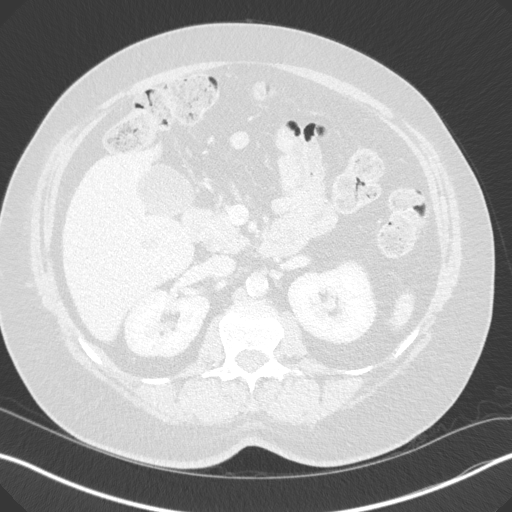
[im 82/132  soft-tissue]
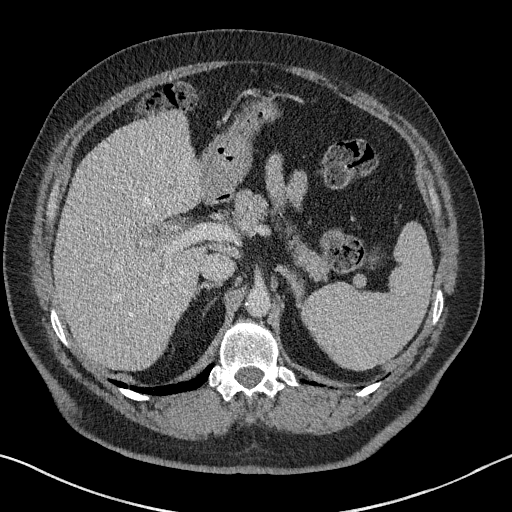
[im 82/132  lung]
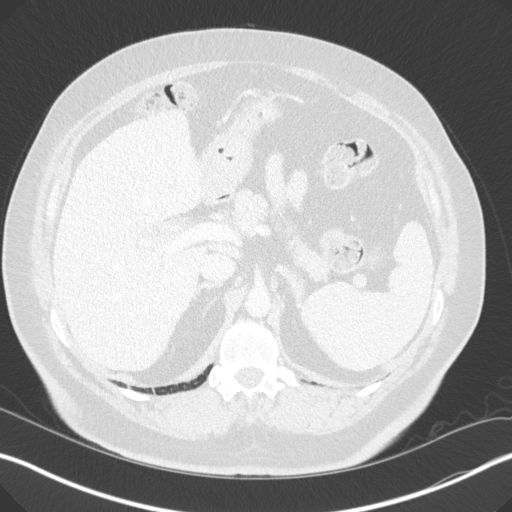
[im 99/132  soft-tissue]
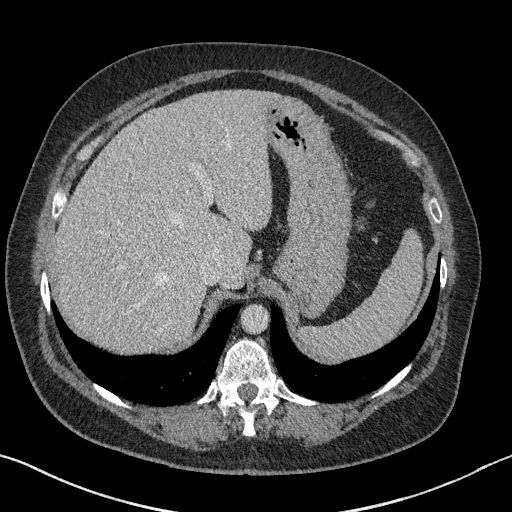
[im 99/132  lung]
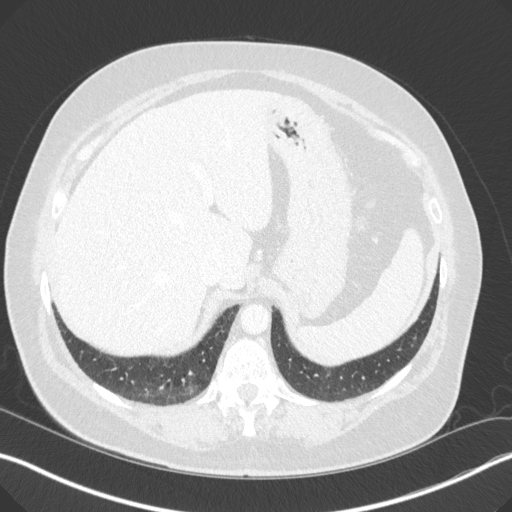
[im 115/132  soft-tissue]
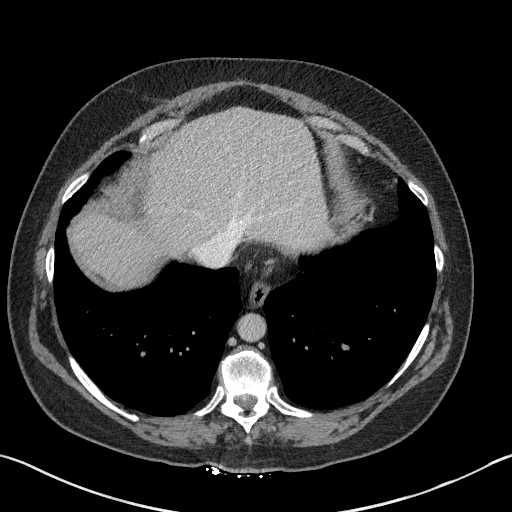
[im 115/132  lung]
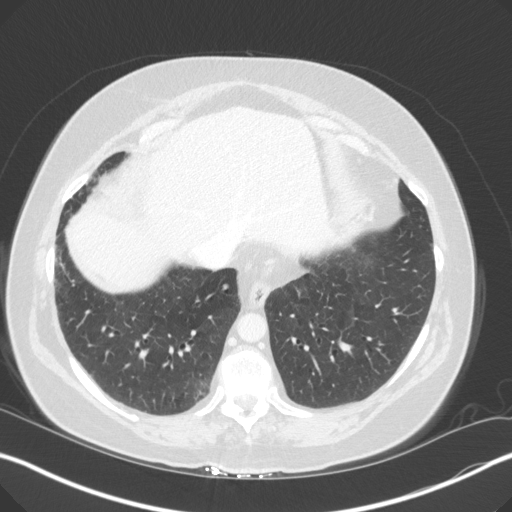

[Series 12: coronal arterial mpr · coronal · arterial · 0.53mm/px · 1 of 157 slices shown, 2 images]
[im 79/157  soft-tissue]
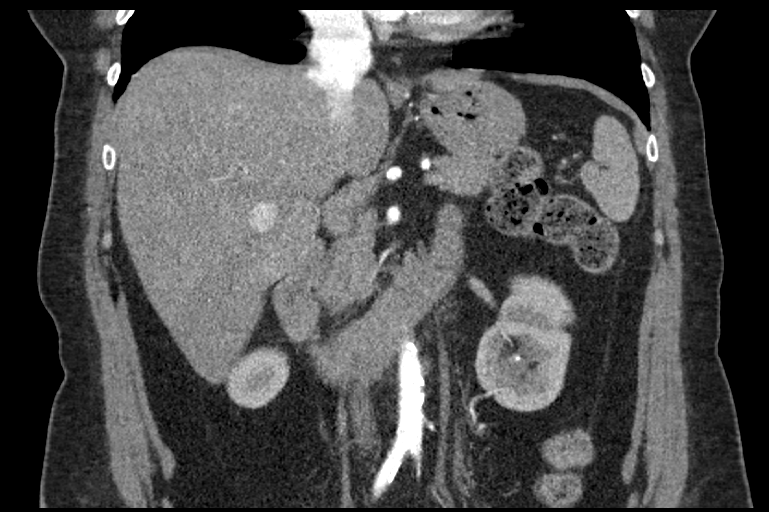
[im 79/157  bone]
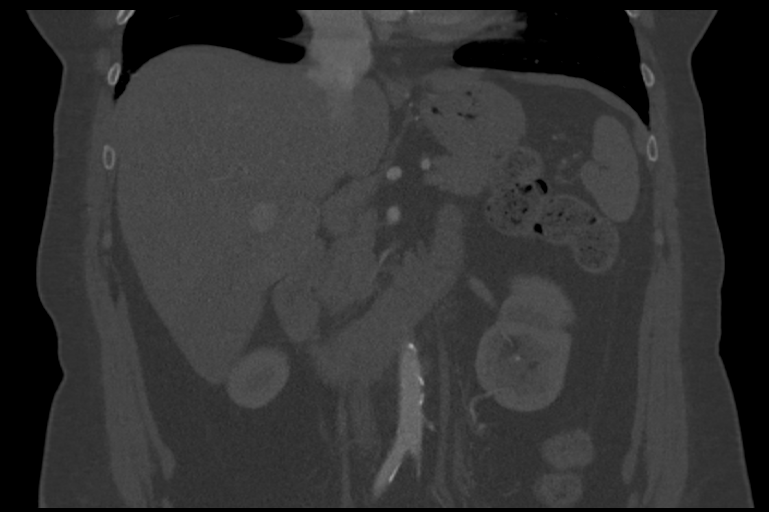

[10 of 46 positions shown; findings below may reference images not displayed]

FINDINGS: VASCULAR

Aorta: Aorta with normal caliber. Calcified and noncalcified
atheromatous plaque. No signs of vasculitis or dissection.

Celiac: Celiac is widely patent at its origin. RIGHT hepatic
arterial branch with pseudoaneurysm measuring 2.2 x 2.2 cm greatest
dimension showing progressive enhancement across subsequent phases
and with peripheral hypoattenuation in the liver substance. Splenic
artery normal caliber. GDA is unremarkable.

SMA: SMA with irregular mild dilation and internal thrombus which is
nearly occlusive approximately 9 cm from its origin thrombus extends
into small branches in the SMA in the mesentery. Luminal thrombus
best seen on image 96 of series 4. Stranding is present around this
vessel within the mesentery. Branch vessel with dilation to
approximately 6 mm on image 88 of series 4 adjacent to the area of
thrombus within the SMA proper. For reference the upstream SMA is
approximately 5 mm. At the level of thrombus it dilates to
approximately 7-8 mm.

Renals: Renal arteries and renal veins are patent. Accessory renal
branch to the upper pole on the LEFT. Accessory renal branch to the
lower pole on the LEFT.

Single RIGHT renal artery.

IMA: IMA is patent though incompletely visualized.

Inflow: Patent without evidence of aneurysm, dissection, vasculitis
or significant stenosis.

Veins: Venous structures in the abdomen are patent.

Review of the MIP images confirms the above findings.

NON-VASCULAR

Lower chest: Incidental imaging of the lung bases is unremarkable.
There is however hypoattenuation in the LEFT ventricular chamber but
this is seen on only [DATE] of images, the arterial phase and could
represent papillary muscle or similar structure or even artifact
though this is uncertain given above findings. Signs of valve
replacement are partially imaged.

Hepatobiliary: Mild biliary duct distension in the inferior RIGHT
hepatic lobe. No gross dilation of the common bile duct.
Pseudoaneurysm of the RIGHT hepatic artery as described. The portal
vein is patent. No signs of shunting to the venous side on the
arterial phase.

Pancreas: Normal, without mass, inflammation or ductal dilatation.

Spleen: Normal

Adrenals/Urinary Tract: Adrenal glands are normal.

Symmetric renal enhancement.  No suspicious renal lesion.

Stomach/Bowel: Gastrointestinal tract with signs of mesenteric
edema. No acute bowel process despite SMA thrombus at this time
though the bowel is incompletely imaged.

Lymphatic: There is no gastrohepatic or hepatoduodenal ligament
lymphadenopathy. No retroperitoneal or mesenteric lymphadenopathy.

Other: No free air.  No pneumatosis.  No ascites.

Musculoskeletal: No acute bone finding. No destructive bone process.
IMPRESSION: Irregularity of the SMA that suggest developing mycotic dilation and
nearly occlusive thrombus in the midportion of the vessel. No
current signs of bowel ischemia though the bowel is incompletely
imaged on the current study. The patient is at extremely high risk
of developing bowel ischemia based on the appearance of the SMA.
Emergent vascular surgery consultation is suggested for further
assessment.

2.3 cm x 2 cm presumed mycotic pseudoaneurysm of the RIGHT hepatic
artery corresponding to the area of abnormality in the porta hepatis
seen on the ultrasound.

Potential filling defect in the LEFT ventricle may represent
thrombus, consider echocardiography for further evaluation.

Peripheral biliary duct distension in the inferior RIGHT hepatic
lobe associated with the pseudoaneurysm arising from the RIGHT
hepatic artery at the porta hepatis.

Critical Value/emergent results were called by telephone at the time
of interpretation on [DATE] at [DATE] to provider IRVIN
, who verbally acknowledged these results. Findings were also
discussed prior to this conversation with IRVIN via telephone,
by me. And approximately [7Z] hours.

Aortic Atherosclerosis ([7Z]-[7Z]).

## 2020-06-16 IMAGING — DX DG ORTHOPANTOGRAM /PANORAMIC
1 series · 1 of 1 positions shown · non-contrast
Comparison: None.

CLINICAL DATA: Poor dentition. UPPER RIGHT broken tooth since
[DATE].

EXAM:
ORTHOPANTOGRAM/PANORAMIC

[view not recorded]
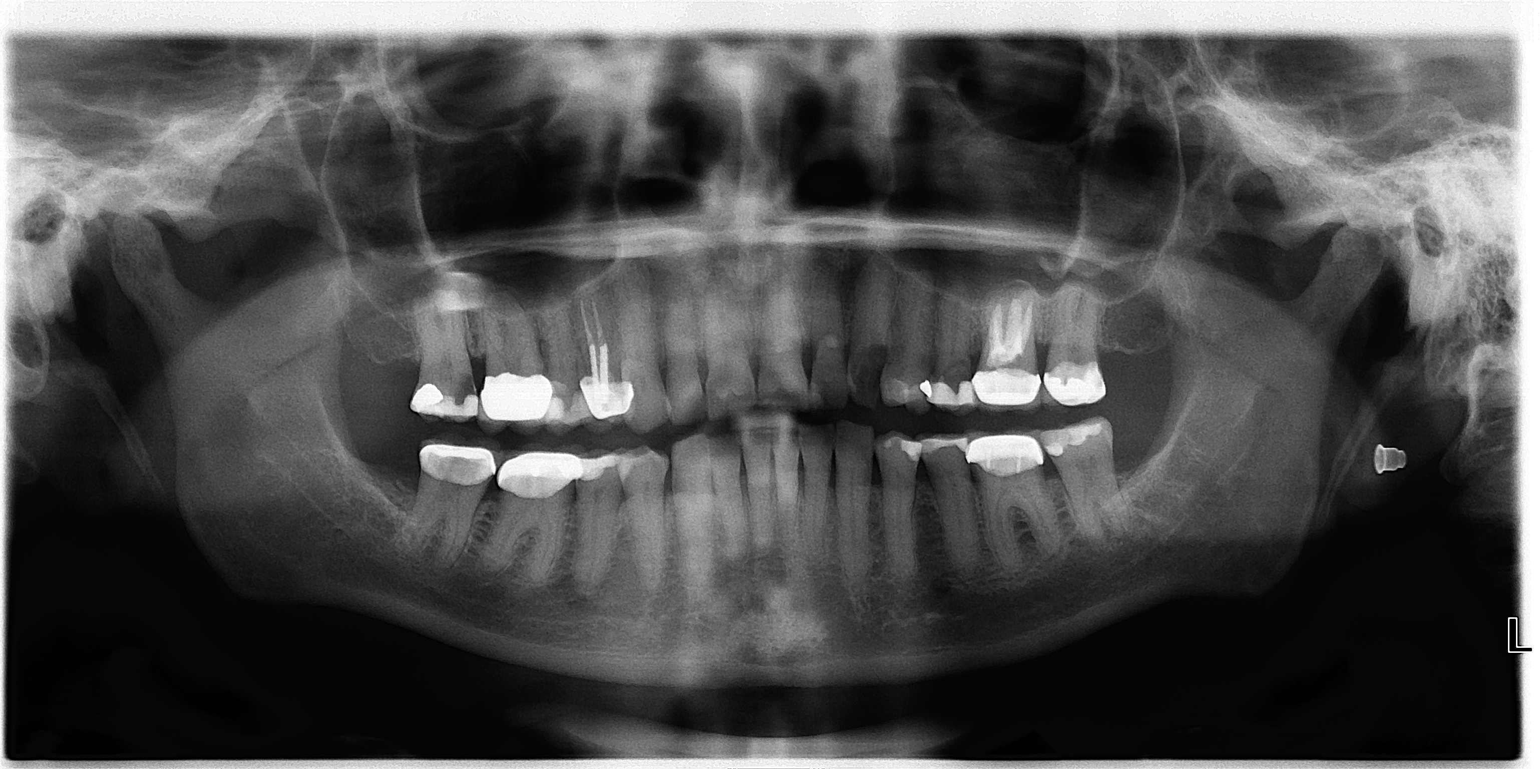

[1 of 1 positions shown; findings below may reference images not displayed]

FINDINGS: There caries in teeth number 2 and 11. Node dental fractures
identified. Mandible is intact.
IMPRESSION: Multiple caries.

## 2020-06-16 IMAGING — MR MR HEAD WO/W CM
12 of 15 series · 36 of 48 positions shown · IV contrast (gadavist)
Comparison: Head CT [DATE].  MRI [DATE].

CLINICAL DATA: Suspected stroke.

EXAM:
MRI HEAD WITHOUT AND WITH CONTRAST
TECHNIQUE: Multiplanar, multiecho pulse sequences of the brain and surrounding
structures were obtained without and with intravenous contrast.
CONTRAST:  9mL GADAVIST GADOBUTROL 1 MMOL/ML IV SOLN

[Series 5: DWI · axial · 3.0mm · 0.88mm/px · z∈[-89,+50]mm · 7 of 96 slices shown (1 of 4)]
[im 1/96]
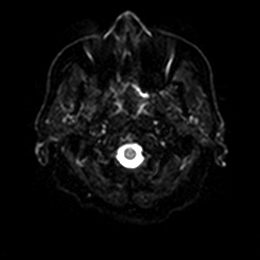
[im 16/96]
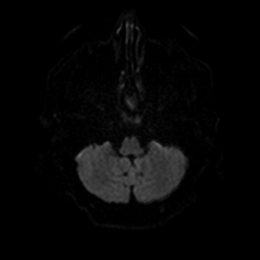
[im 32/96]
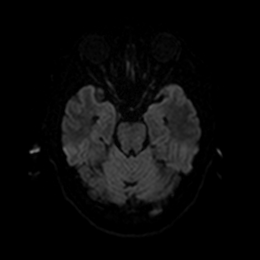
[im 48/96]
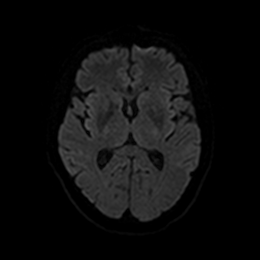
[im 64/96]
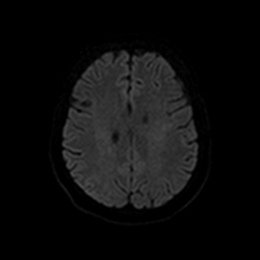
[im 80/96]
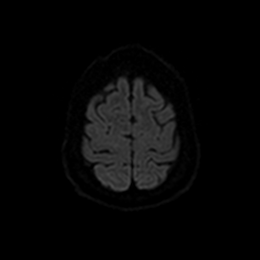
[im 96/96]
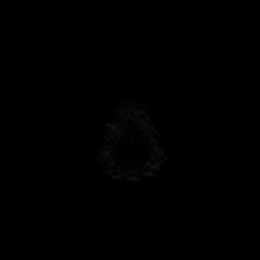

[Series 6: DWI · axial · 3.0mm · 0.88mm/px · z∈[-89,+50]mm · 3 of 48 slices shown (2 of 4)]
[im 1/48]
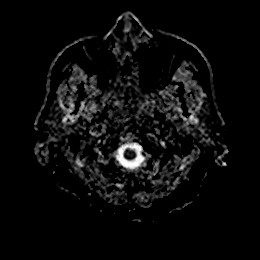
[im 24/48]
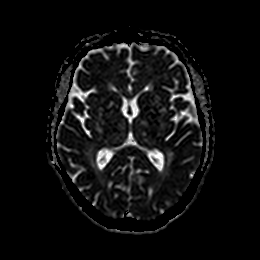
[im 48/48]
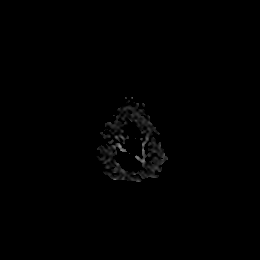

[Series 7: DWI · coronal · 4.0mm · 0.88mm/px · 4 of 64 slices shown (3 of 4)]
[im 1/64]
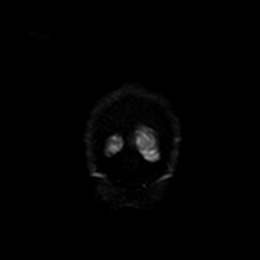
[im 22/64]
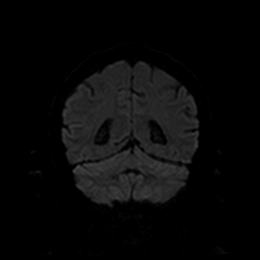
[im 43/64]
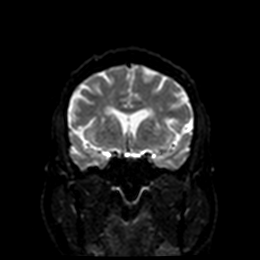
[im 64/64]
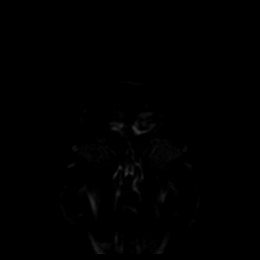

[Series 8: DWI · coronal · 4.0mm · 0.88mm/px · 2 of 32 slices shown (4 of 4)]
[im 1/32]
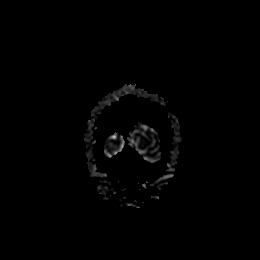
[im 32/32]
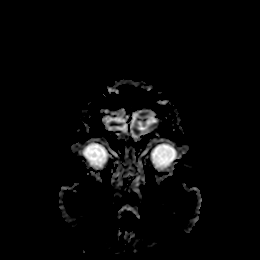

[Series 9: T1 · sagittal · 5.0mm · 0.75mm/px · 2 of 23 slices shown]
[im 1/23]
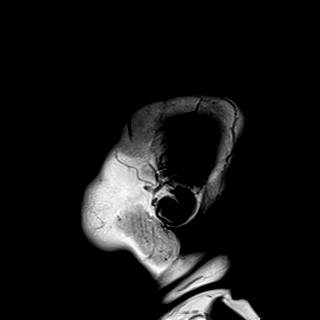
[im 23/23]
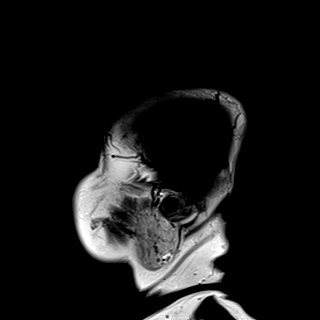

[Series 10: T2 · axial · 5.0mm · 0.72mm/px · z∈[-91,+51]mm · 2 of 25 slices shown]
[im 1/25]
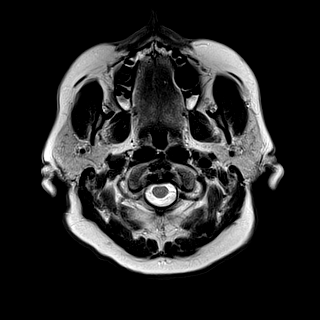
[im 25/25]
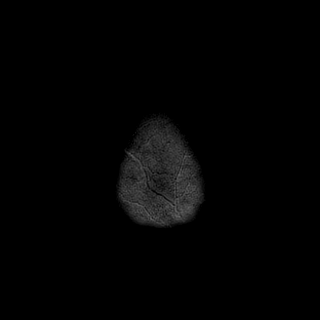

[Series 11: FLAIR · axial · 5.0mm · 0.45mm/px · z∈[-91,+51]mm · 2 of 25 slices shown]
[im 1/25]
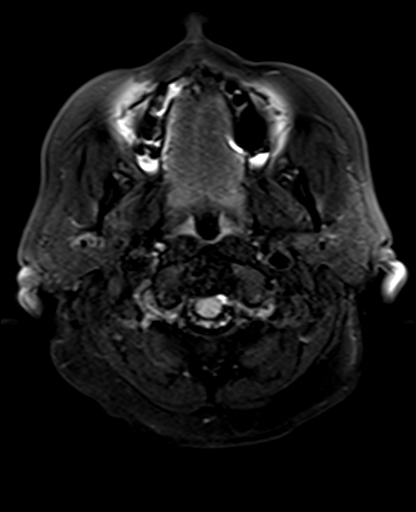
[im 25/25]
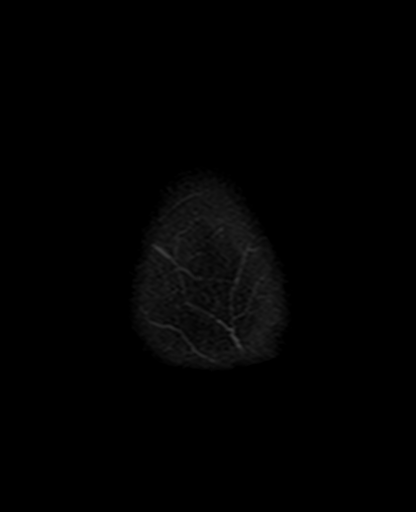

[Series 13: pha_images · axial · 3.0mm · 0.90mm/px · z∈[-107,+68]mm · 4 of 58 slices shown]
[im 1/58]
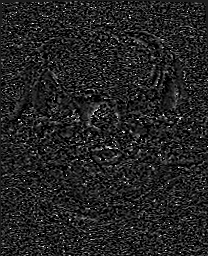
[im 20/58]
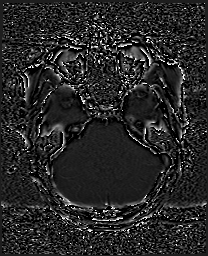
[im 39/58]
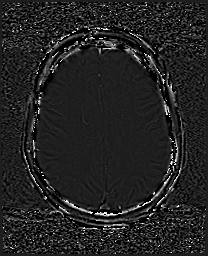
[im 58/58]
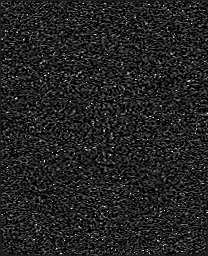

[Series 14: swi_images · axial · 3.0mm · 0.90mm/px · z∈[-107,+68]mm · 4 of 60 slices shown]
[im 1/60]
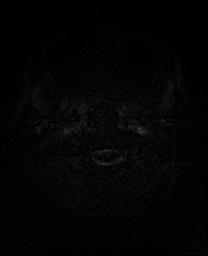
[im 20/60]
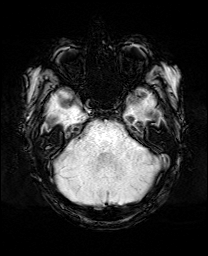
[im 40/60]
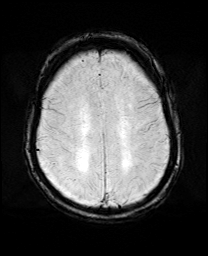
[im 60/60]
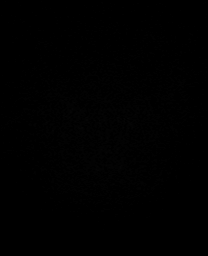

[Series 17: T2 post-contrast · coronal · 5.0mm · 0.72mm/px · 2 of 28 slices shown]
[im 1/28]
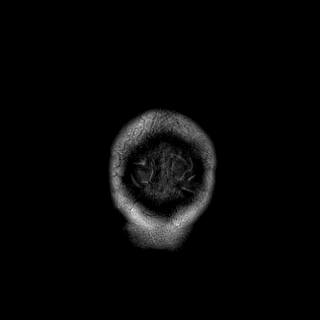
[im 28/28]
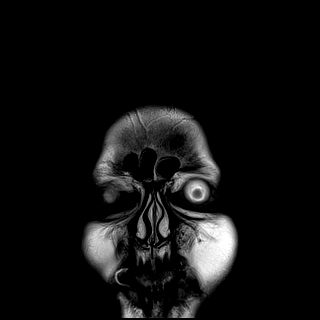

[Series 19: T1 post-contrast · coronal · 5.0mm · 0.34mm/px · 2 of 28 slices shown (1 of 2)]
[im 1/28]
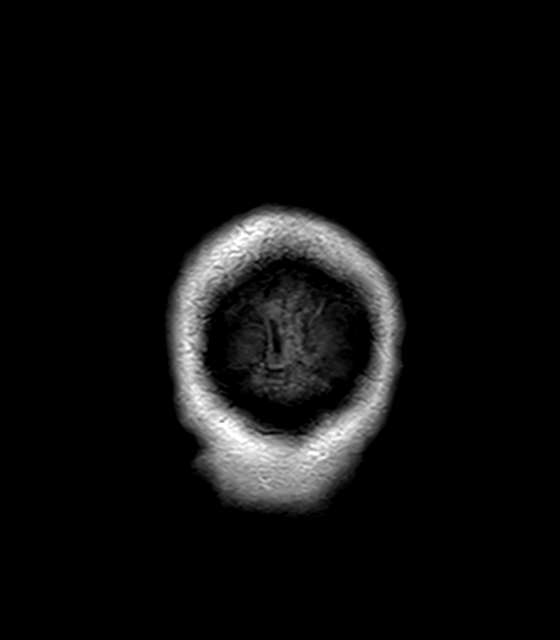
[im 28/28]
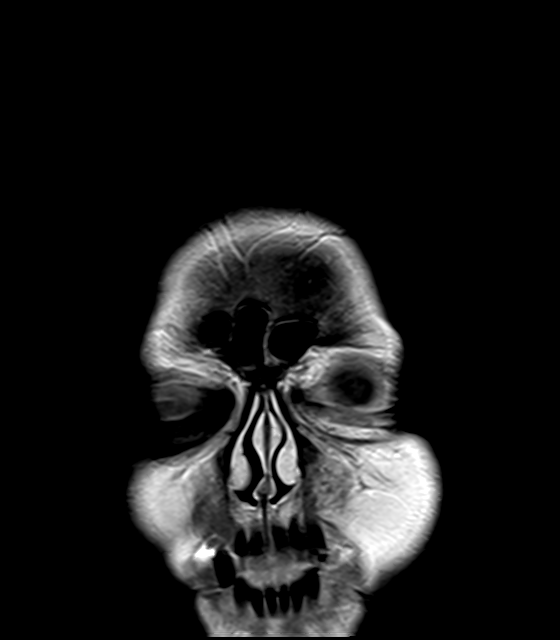

[Series 20: T1 post-contrast · sagittal · 5.0mm · 0.72mm/px · 2 of 23 slices shown (2 of 2)]
[im 1/23]
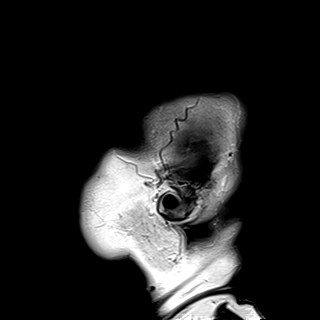
[im 23/23]
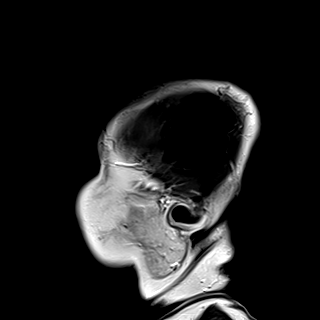

[36 of 48 positions shown; findings below may reference images not displayed]

FINDINGS: Brain: Diffusion imaging does not show any acute infarction. There
is a subcentimeter subacute infarction in the right parietal
subcortical white matter showing very minimal restricted diffusion
and some contrast enhancement. There is a second subacute infarction
at the left parieto-occipital cortex showing similar features.
Extensive chronic small-vessel ischemic changes are seen throughout
pons. No focal cerebellar insult. Cerebral hemispheres show old
small vessel ischemic changes of the thalami, basal ganglia and
extensively throughout the cerebral hemispheric deep and subcortical
white matter. A few of those old white matter insults show T2 shine
through, but none are truly acute. No large vessel territory
infarction. No mass lesion, hemorrhage, hydrocephalus or extra-axial
collection. Compared to the study [DATE], the findings are very
similar other than the 2 subacute infarctions described above.

Vascular: Major vessels at the base of the brain show flow.

Skull and upper cervical spine: Negative

Sinuses/Orbits: Clear/normal

Other: None
IMPRESSION: 1. Subacute subcentimeter infarctions in the right parietal
subcortical white matter and left parieto-occipital cortex. No mass
effect or hemorrhage. No sign of hyperacute infarction.
2. Extensive chronic small-vessel ischemic changes elsewhere
throughout the brain as outlined above.

## 2020-06-21 IMAGING — CT CT CTA ABD/PEL W/CM AND/OR W/O CM
3 of 9 series · 11 of 46 positions shown, 17 images · IV contrast (omnipaque)
Comparison: [DATE]

CLINICAL DATA: Hepatic artery pseudoaneurysm

EXAM:
CTA ABDOMEN AND PELVIS WITH CONTRAST
TECHNIQUE: Multidetector CT imaging of the abdomen and pelvis was performed
using the standard protocol during bolus administration of
intravenous contrast. Multiplanar reconstructed images and MIPs were
obtained and reviewed to evaluate the vascular anatomy.
CONTRAST:  100mL OMNIPAQUE IOHEXOL 350 MG/ML SOLN

[Series 5: renal cta · axial · 0.84mm/px · z∈[+649,+724]mm · 2 of 176 slices shown]
[im 13/176  soft-tissue]
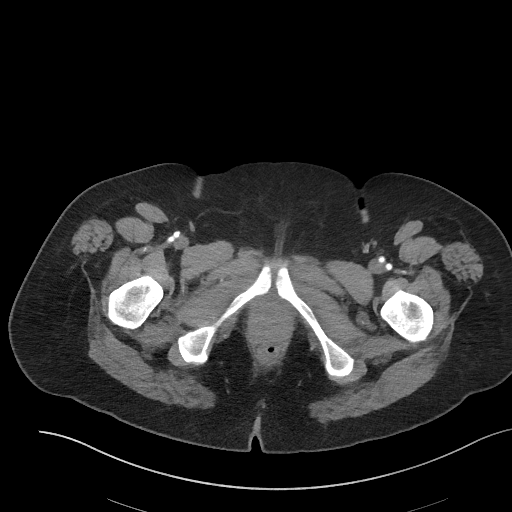
[im 38/176  soft-tissue]
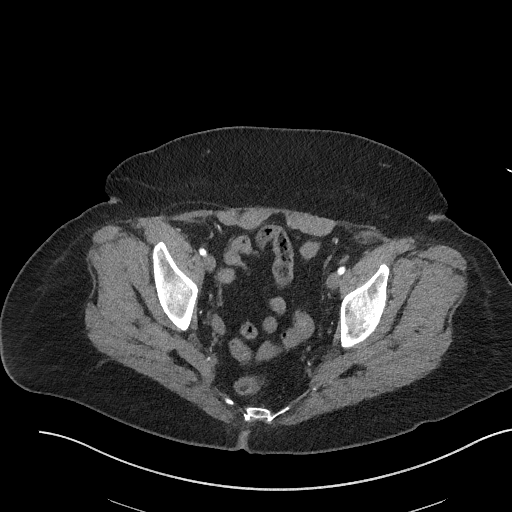

[Series 7: renal cta cor · coronal · 0.77mm/px · 2 of 151 slices shown, 3 images]
[im 51/151  soft-tissue]
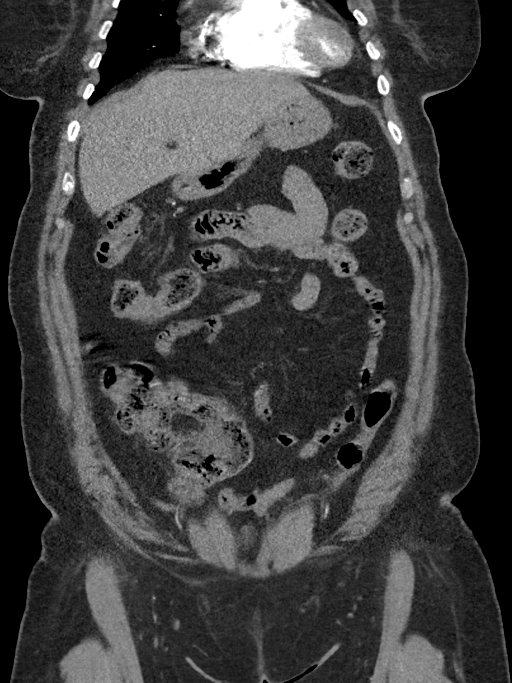
[im 51/151  bone]
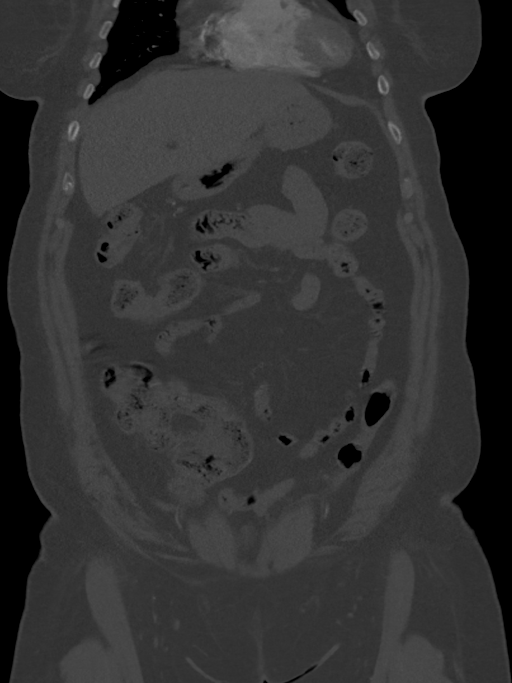
[im 101/151  soft-tissue]
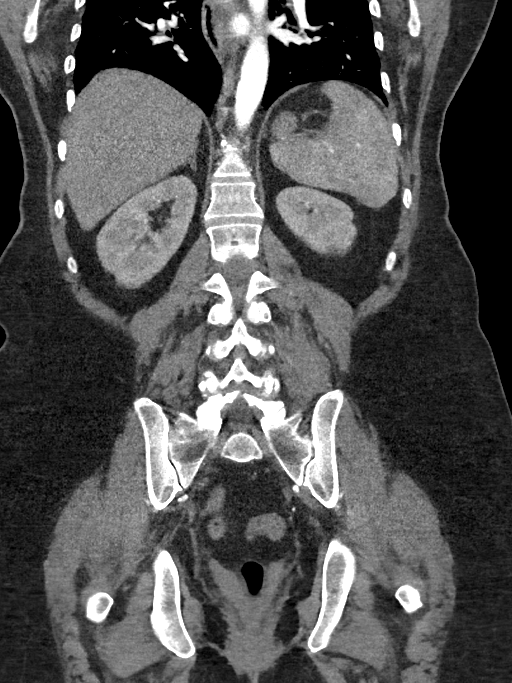

[Series 11: venous 5.0 · axial · portal-venous · 0.84mm/px · z∈[+678,+1068]mm · 7 of 106 slices shown, 12 images]
[im 14/106  soft-tissue]
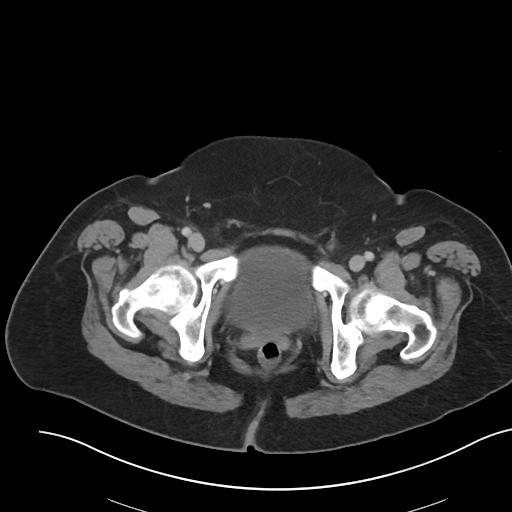
[im 14/106  bone]
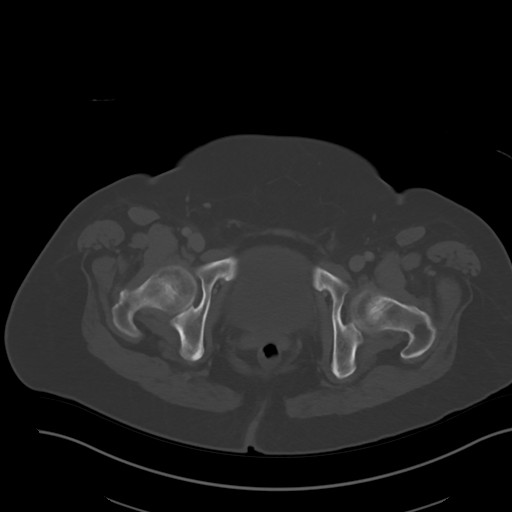
[im 27/106  soft-tissue]
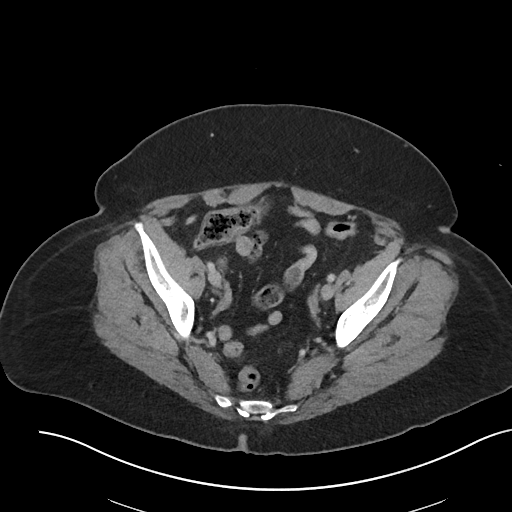
[im 40/106  soft-tissue]
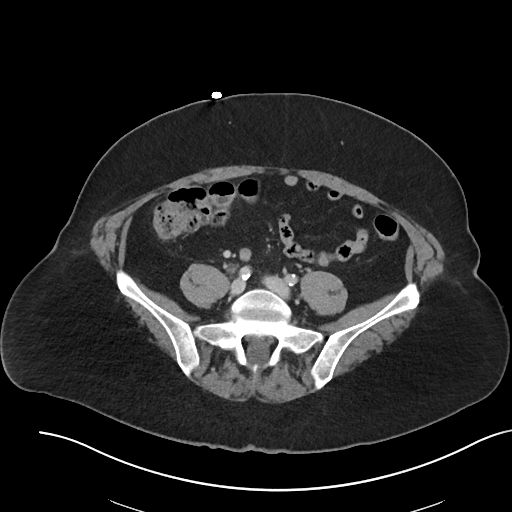
[im 53/106  soft-tissue]
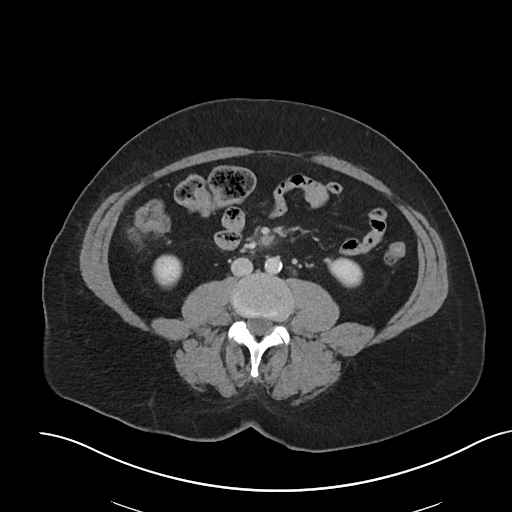
[im 53/106  lung]
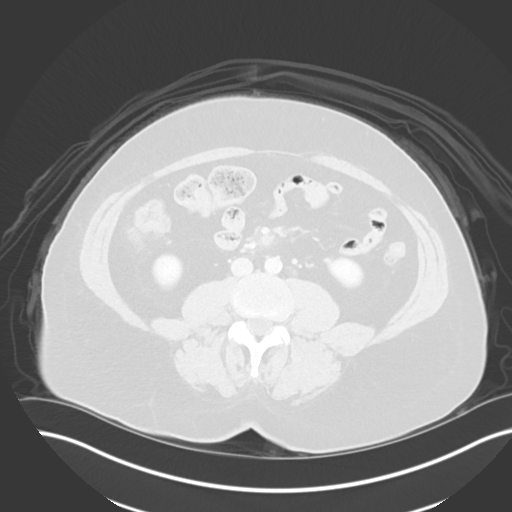
[im 66/106  soft-tissue]
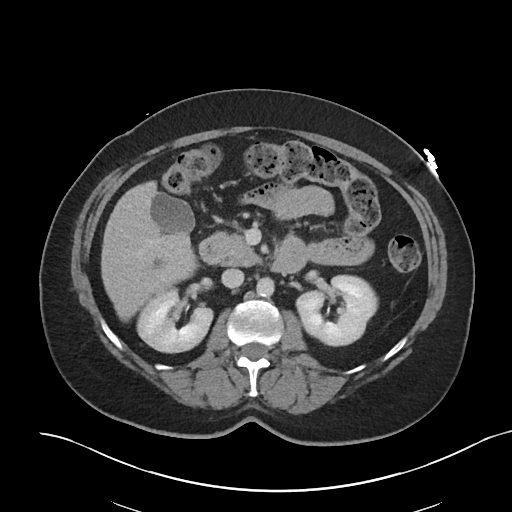
[im 66/106  lung]
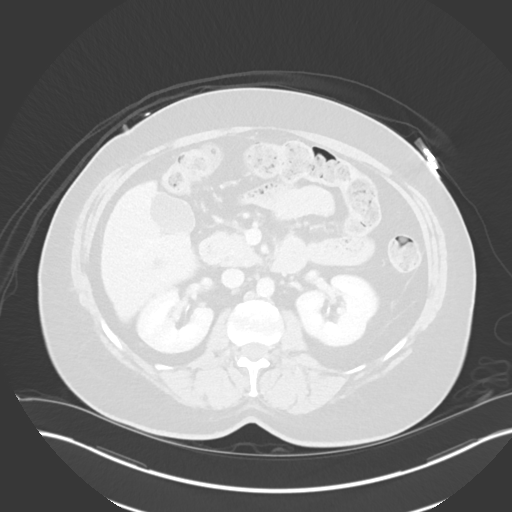
[im 79/106  soft-tissue]
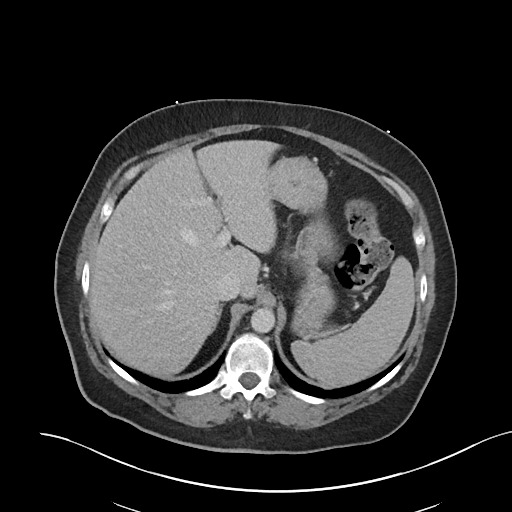
[im 79/106  lung]
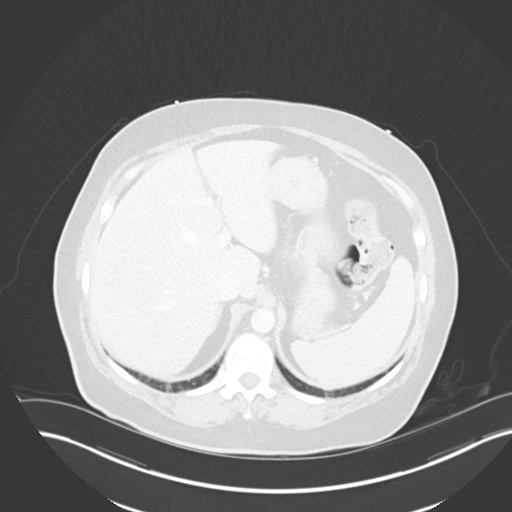
[im 92/106  soft-tissue]
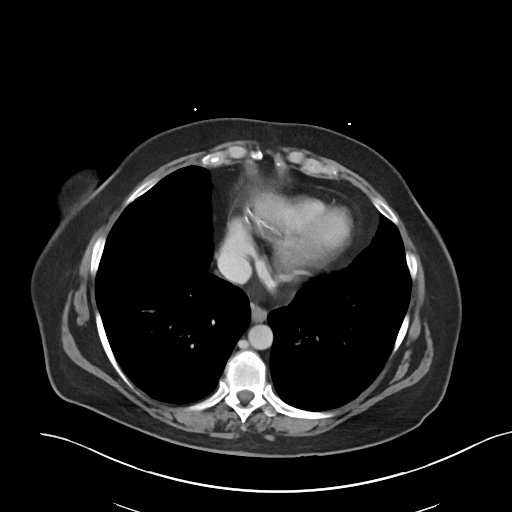
[im 92/106  lung]
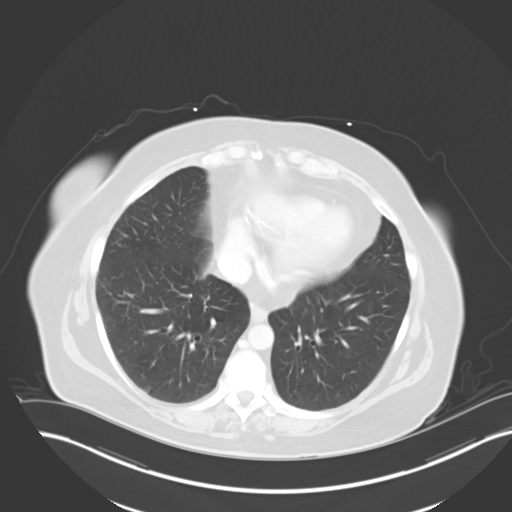

[11 of 46 positions shown; findings below may reference images not displayed]

FINDINGS: VASCULAR

Aorta: No aneurysm, dissection, or stenosis. Moderate calcified
plaque in the infrarenal segment.

Celiac: Patent. Interval thrombosis of the proximal right hepatic
artery pseudoaneurysm. Right and left hepatic artery branches remain
patent. Splenic artery unremarkable.

SMA: Patent proximally. 6 mm pseudoaneurysm just beyond the origin
of the ileocolic branch. There is a 7 mm pseudoaneurysm in the
adjacent ileal branch, with new segmental occlusion distally. There
are regional inflammatory changes in the mesentery.

Renals: Duplicated left, inferior dominant, both patent. Single
right, widely patent.

IMA: Patent without evidence of aneurysm, dissection, vasculitis or
significant stenosis.

Inflow: Scattered calcified plaque in common and internal iliac
arteries without stenosis or aneurysm.

Proximal Outflow: Bilateral common femoral and visualized portions
of the superficial and profunda femoral arteries are patent without
evidence of aneurysm, dissection, vasculitis or significant
stenosis.

Veins: Patent hepatic veins, portal vein, bilateral renal veins,
iliac venous system and IVC. No venous pathology identified.

Review of the MIP images confirms the above findings.

NON-VASCULAR

Lower chest: Previous AVR. Scattered poorly marginated faint
ground-glass opacities posteriorly in both lung bases. No pleural or
pericardial effusion. Coronary calcifications.

Hepatobiliary: 3.5 cm ill-defined collection in segment 5, site of
previous pseudoaneurysm. No new lesion. No biliary ductal
dilatation. Gallbladder unremarkable.

Pancreas: Unremarkable. No pancreatic ductal dilatation or
surrounding inflammatory changes.

Spleen: Normal in size without focal abnormality.

Adrenals/Urinary Tract: Adrenal glands are unremarkable. 4 mm
calculus in the lower pole left renal collecting system. Kidneys are
otherwise normal, without focal lesion, or hydronephrosis. Bladder
is unremarkable.

Stomach/Bowel: Stomach is nondistended, unremarkable.

Small bowel nondilated, without focal wall thickening identified.

Appendix normal.

The colon is nondilated without wall thickening or inflammatory
changes.

Lymphatic: No abdominal or pelvic adenopathy.

Reproductive: Status post hysterectomy. No adnexal masses.

Other: No ascites.  No free air.

Musculoskeletal: No acute or significant osseous findings.
IMPRESSION: 1. Interval thrombosis of the proximal right hepatic artery
pseudoaneurysm.
2. 6 mm proximal ileocolic artery pseudoaneurysm, and 7 mm
pseudoaneurysm in an adjacent ileal branch, with new segmental
occlusion distally. No evidence of bowel ischemia.
3. Left nephrolithiasis without hydronephrosis.
4. Coronary calcifications.

Aortic Atherosclerosis ([XE]-[XE]).

## 2020-07-04 IMAGING — CT CT ANGIO ABDOMEN
3 of 14 series · 10 of 46 positions shown, 16 images · IV contrast (OMNIPAQUE)
Comparison: [DATE], [DATE], [DATE]

CLINICAL DATA: 64-year-old female with history of spontaneously
thrombosed hepatic artery pseudoaneurysm and multifocal distal SMA
aneurysms, likely mycotic. Short-term follow-up study after recent
hospital discharge.

EXAM:
CT ANGIOGRAPHY ABDOMEN
TECHNIQUE: Multidetector CT imaging of the abdomen was performed using the
standard protocol during bolus administration of intravenous
contrast. Multiplanar reconstructed images and MIPs were obtained
and reviewed to evaluate the vascular anatomy.
CONTRAST:  100mL OMNIPAQUE IOHEXOL 350 MG/ML SOLN

[Series 5: axial arterial · axial · arterial · 0.77mm/px · z∈[+1184,+1222]mm · 2 of 149 slices shown]
[im 19/149  soft-tissue]
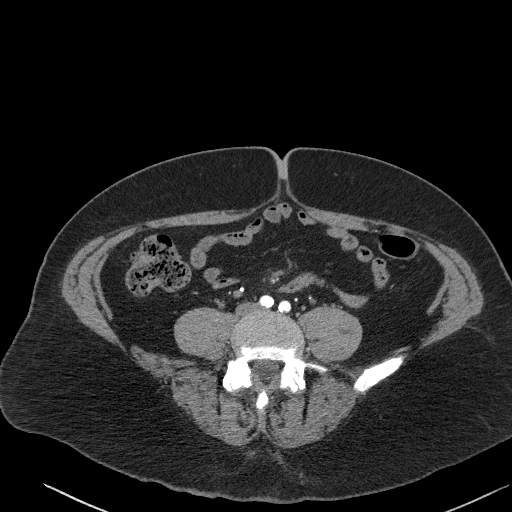
[im 38/149  soft-tissue]
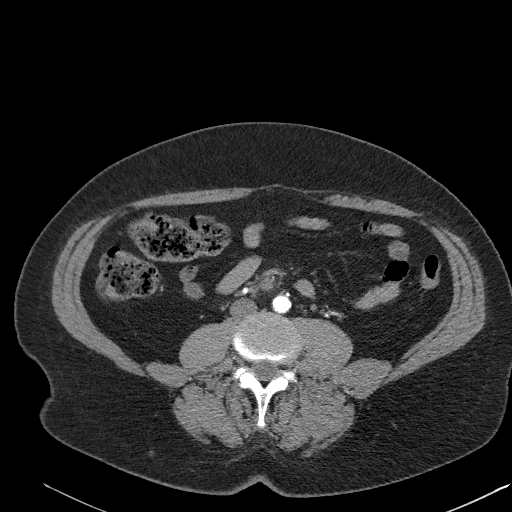

[Series 6: axial venous · axial · portal-venous · 0.77mm/px · z∈[+1184,+1406]mm · 7 of 149 slices shown, 12 images]
[im 19/149  soft-tissue]
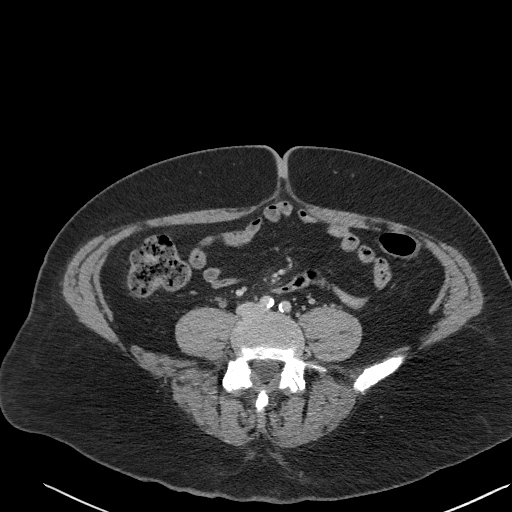
[im 19/149  bone]
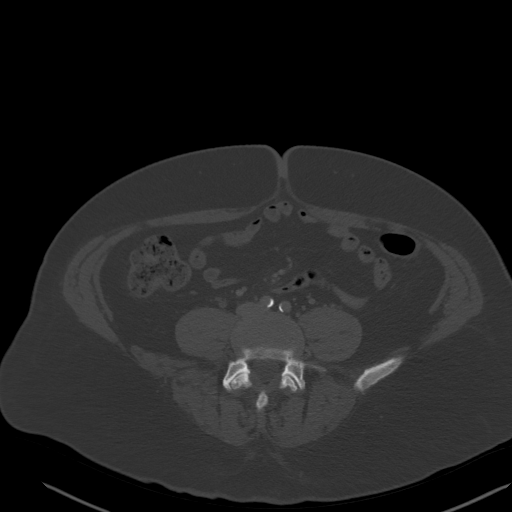
[im 38/149  soft-tissue]
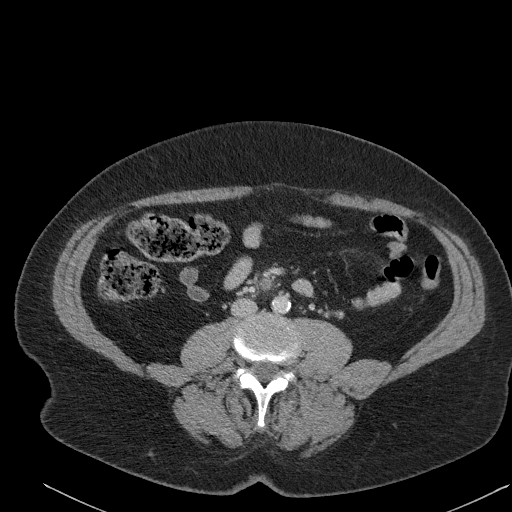
[im 56/149  soft-tissue]
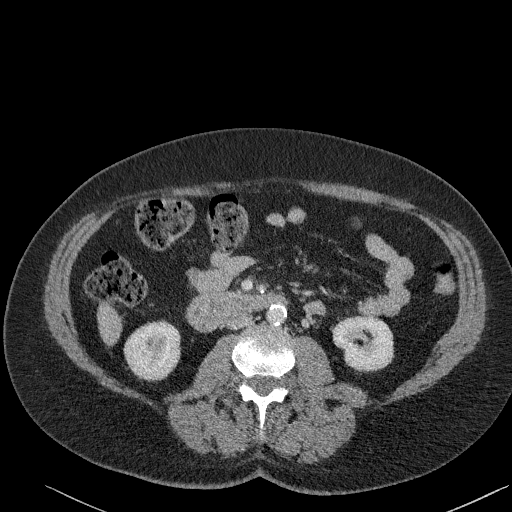
[im 75/149  soft-tissue]
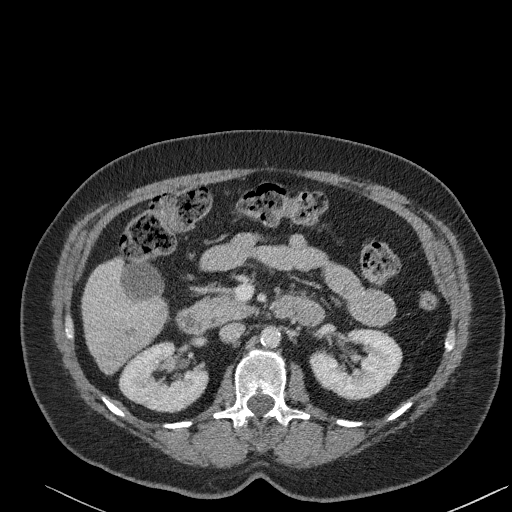
[im 75/149  lung]
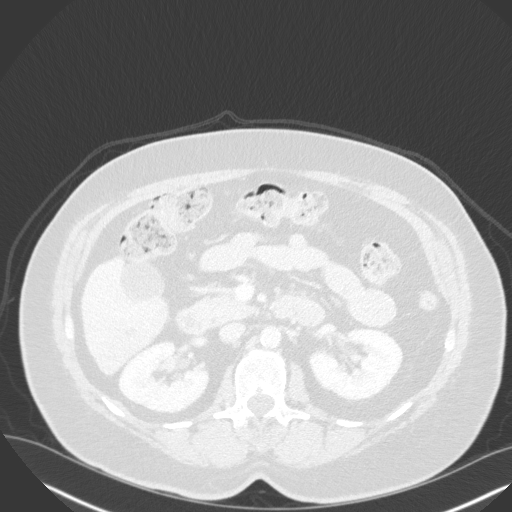
[im 93/149  soft-tissue]
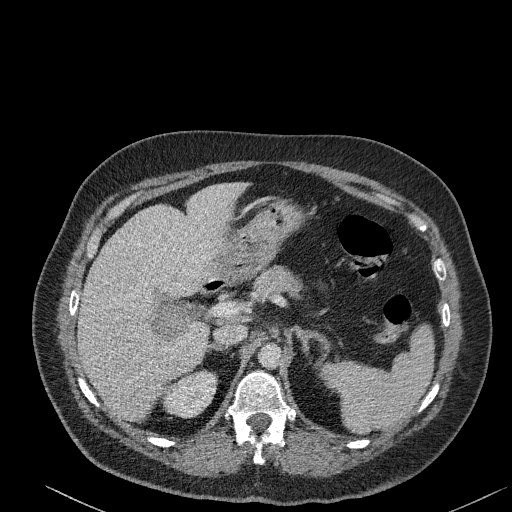
[im 93/149  lung]
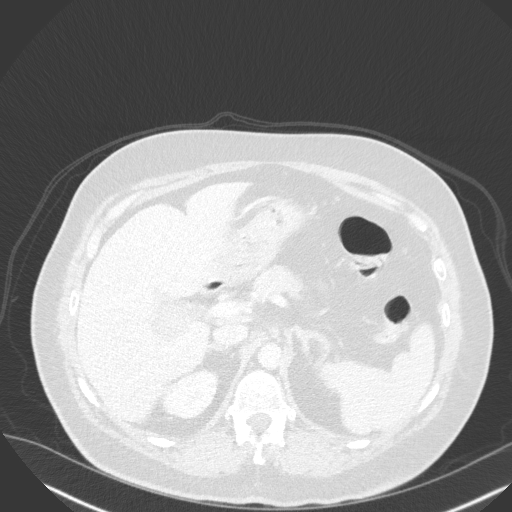
[im 112/149  soft-tissue]
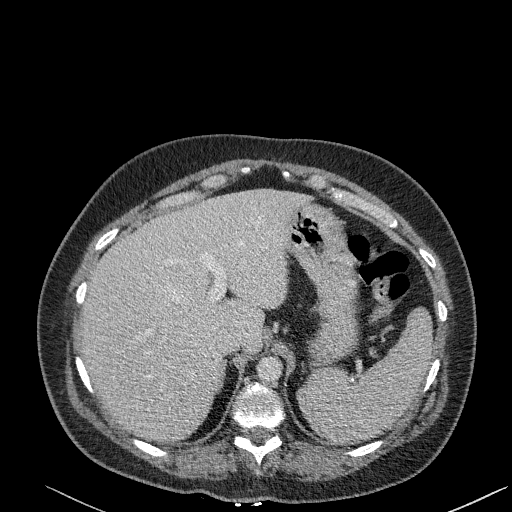
[im 112/149  lung]
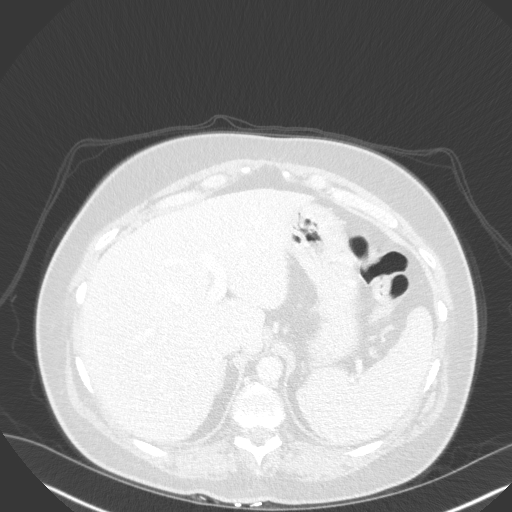
[im 130/149  soft-tissue]
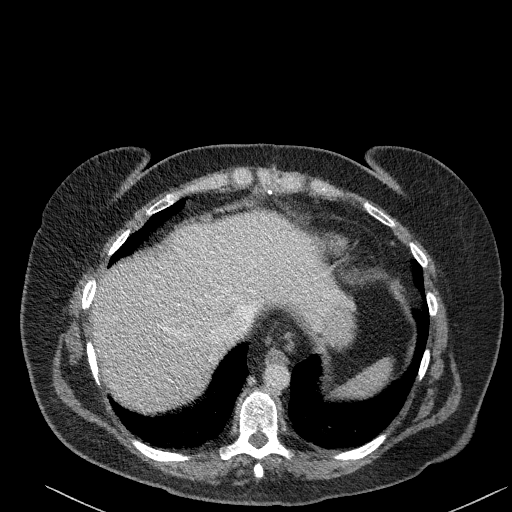
[im 130/149  lung]
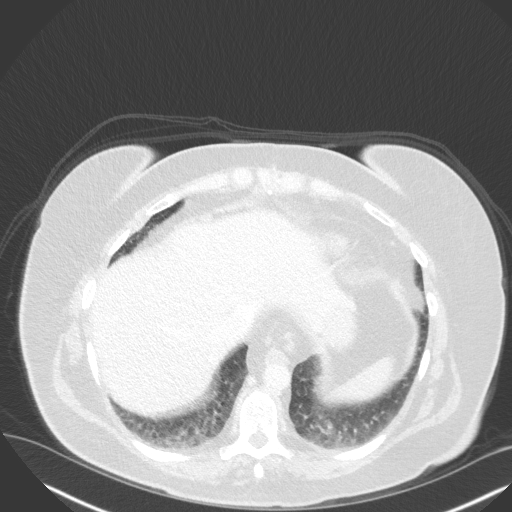

[Series 12: coronal mpr · coronal · 0.69mm/px · 1 of 117 slices shown, 2 images]
[im 59/117  soft-tissue]
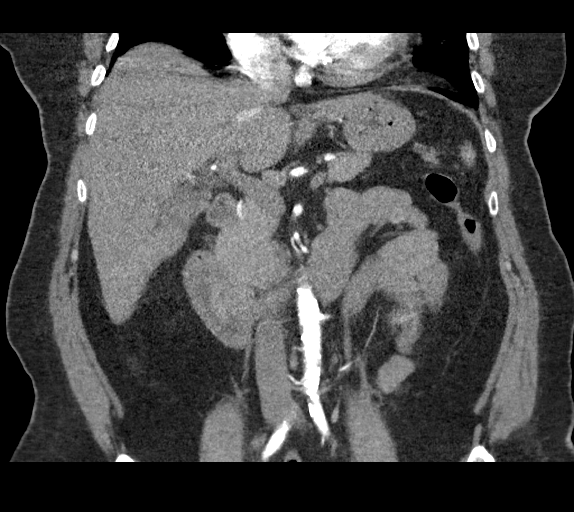
[im 59/117  bone]
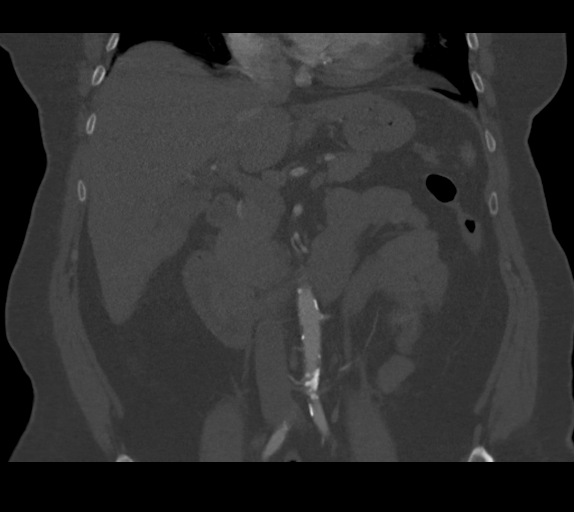

[10 of 46 positions shown; findings below may reference images not displayed]

FINDINGS: VASCULAR

Aorta: Normal caliber aorta without aneurysm, dissection, vasculitis
or significant stenosis. Atherosclerotic calcifications which are
most prominent in the infrarenal abdominal aorta.

Celiac: Patent without evidence of aneurysm, dissection, vasculitis
or significant stenosis. There is an accessory left hepatic artery
arising from the left gastric artery.

SMA: Patent normal caliber proximally. About the distal bifurcation
of the main SMA again seen are 2 aneurysms, 1 arising from the
proximal ileocolic artery measuring up to 7.5 mm, previously 5.7 mm.
About the medially branching terminal SMA there is a partially
thrombosed aneurysm measuring up to approximately 6.7 mm, previously
6.8 mm. There is there is an approximately 5.4 cm occlusion of the
terminal SMA with surrounding inflammatory changes and distal
reconstitution. On delayed venous phase, there is peripheral
enhancement about this occluded segment. The remaining SMA branches
appear patent.

Renals: Single right triple left renal arteries are patent without
evidence of aneurysm, dissection, vasculitis, fibromuscular
dysplasia or significant stenosis.

IMA: Patent proximally.

Inflow: Atherosclerotic calcifications about the proximal bilateral
common iliac arteries without evidence of flow-limiting stenosis.

Veins: The portal system is widely patent. No evidence of portal
venous gas. Hepatic veins are patent. No evidence of ileo caval
thrombosis. The renal veins are patent.

Review of the MIP images confirms the above findings.

NON-VASCULAR

Lower chest: No acute abnormality.

Hepatobiliary: The liver is normal in size, contour, and
attenuation. Within segment 5, adjacent to the gallbladder fossa is
a central area of hyperattenuation surrounded by a halo of
hypoattenuation (measures approximately 2.7 x 2.9 cm, previously
by 3.3 cm) which does not change on contrasted phases. The
gallbladder is unremarkable. The focal right posterior biliary
radical that was dilated on prior studies is less conspicuous today.
No additional intra or extrahepatic biliary ductal dilation.

Pancreas: Unremarkable. No pancreatic ductal dilatation or
surrounding inflammatory changes.

Spleen: Normal in size without focal abnormality.

Adrenals/Urinary Tract: Adrenal glands are unremarkable. Similar
appearing 3 mm left inferior pole renal calculus. Kidneys are
otherwise normal without focal lesion or hydronephrosis.

Stomach/Bowel: The stomach, small bowel, and visualized colon are
within normal limits. No is to pneumatosis. No dilation or focal
mass identified.

Lymphatic: No abdominal lymphadenopathy.

Other: No ascites.  Tiny fat containing umbilical hernia.

Musculoskeletal: No acute or significant osseous findings.
IMPRESSION: VASCULAR

1. Persistent distal SMA mycotic aneurysms arising from the proximal
ileocolic artery (0.7 cm) and proximal aspect of the terminal SMA
(0.7 cm) with associated 5.4 cm occlusion of the terminal SMA with
surrounding inflammatory changes, compatible with septic
thromboembolic etiology. There is distal reconstitution of the
terminal SMA beyond the occlusion.
2. Persistently thrombosed right hepatic artery pseudoaneurysm with
interval decreased size (2.9 cm, previously 3.5 cm). Interval
decreased conspicuity of previously visualized obstructed right
posterior bile duct secondary to extrinsic compression from
pseudoaneurysm.
3.  Aortic Atherosclerosis ([YC]-[YC]).

NON-VASCULAR

1. No acute abdominal abnormality, specifically no evidence of
mesenteric ischemia.
2. Similar appearing nonobstructive left nephrolithiasis.

## 2020-07-26 IMAGING — US US EXTREM LOW VENOUS*L*
1 series · 14 of 24 positions shown · non-contrast
Comparison: None.

CLINICAL DATA: Pain

EXAM:
LEFT LOWER EXTREMITY VENOUS DOPPLER ULTRASOUND
TECHNIQUE: Gray-scale sonography with compression, as well as color and duplex
ultrasound, were performed to evaluate the deep venous system(s)
from the level of the common femoral vein through the popliteal and
proximal calf veins.

[Series 1: us venous img lower uni left (dvt) · portal-venous · 14 of 49 slices shown]
[im 1/49]
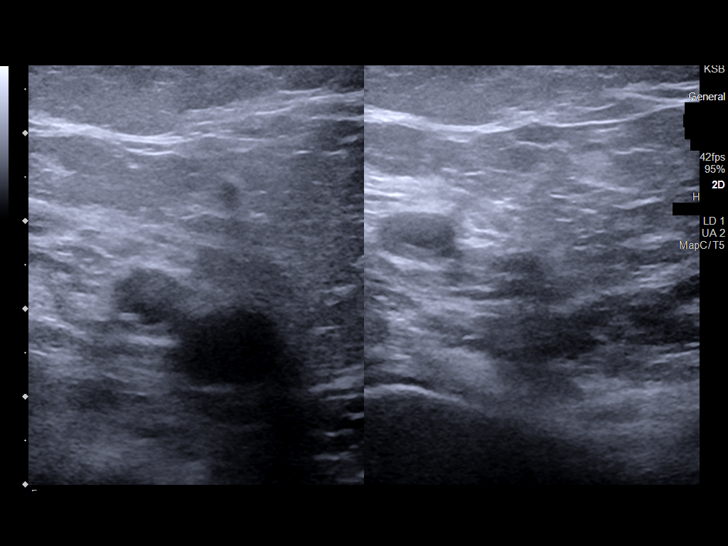
[im 5/49]
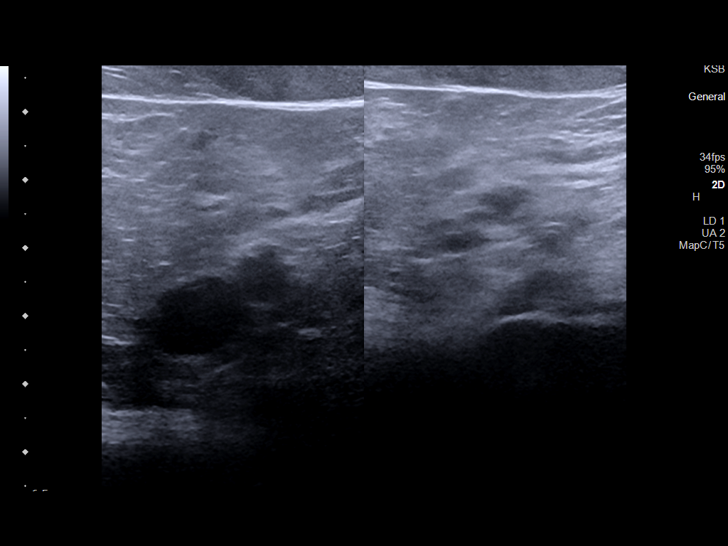
[im 9/49]
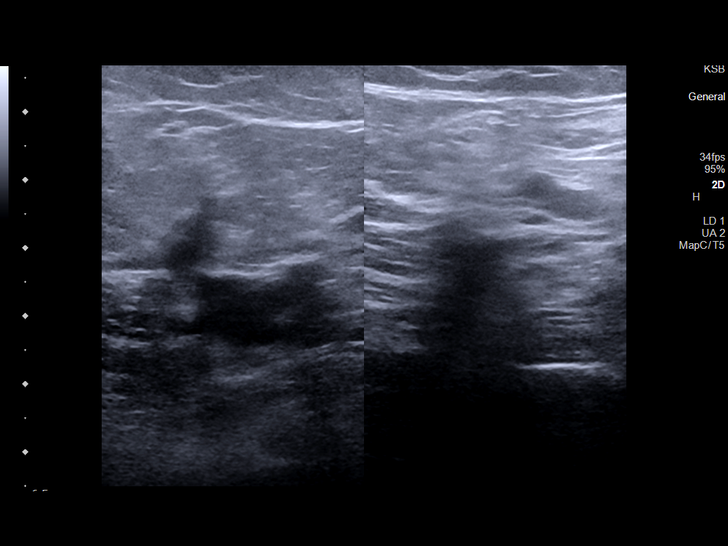
[im 13/49]
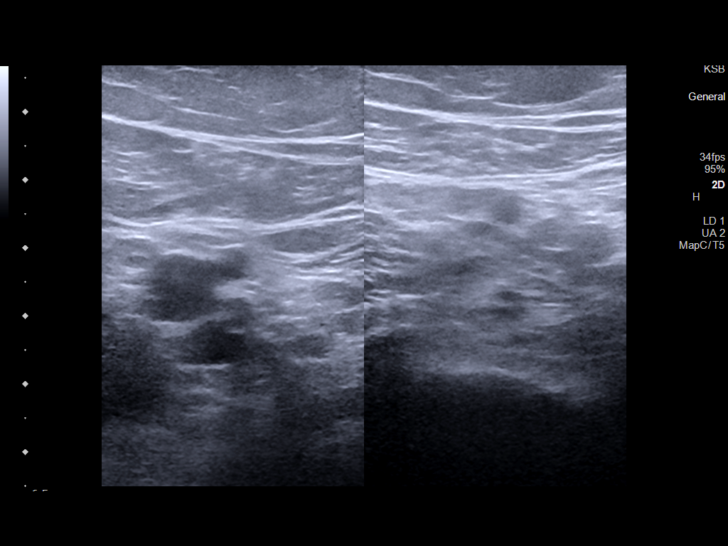
[im 15/49]
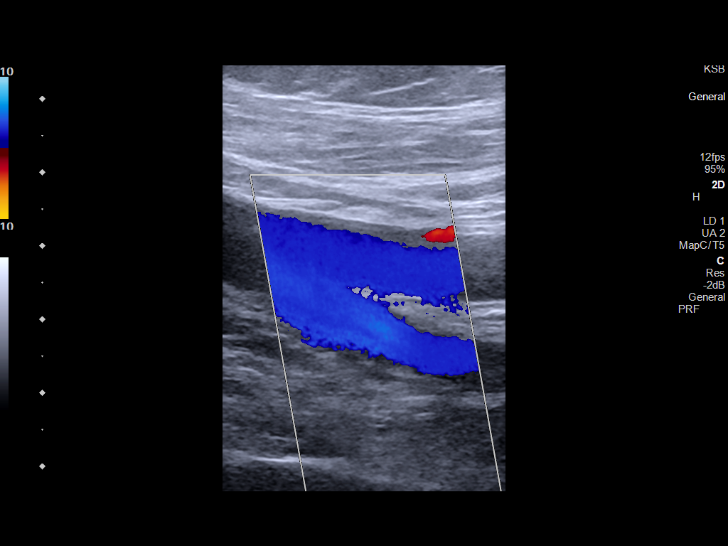
[im 19/49]
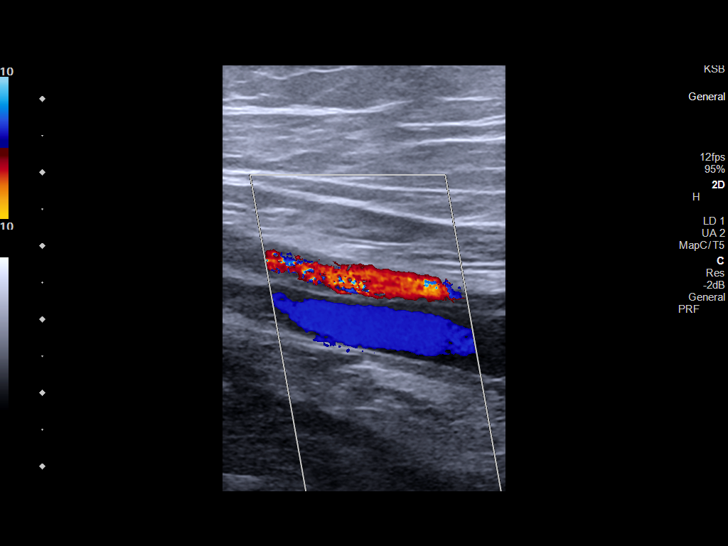
[im 23/49]
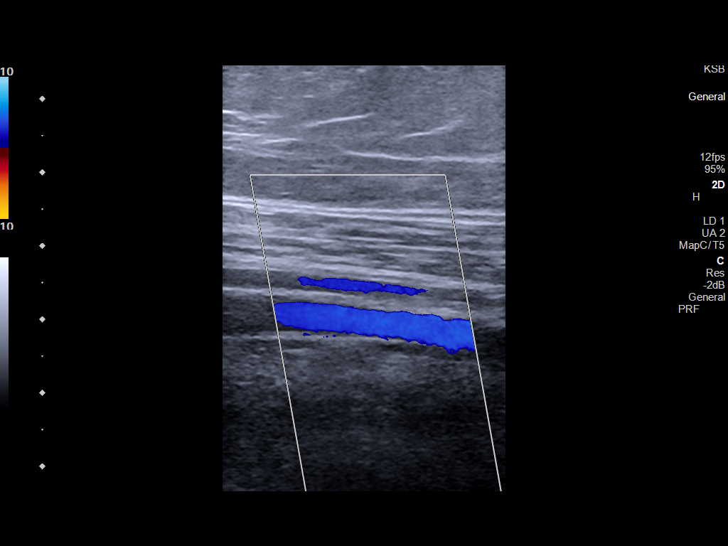
[im 26/49]
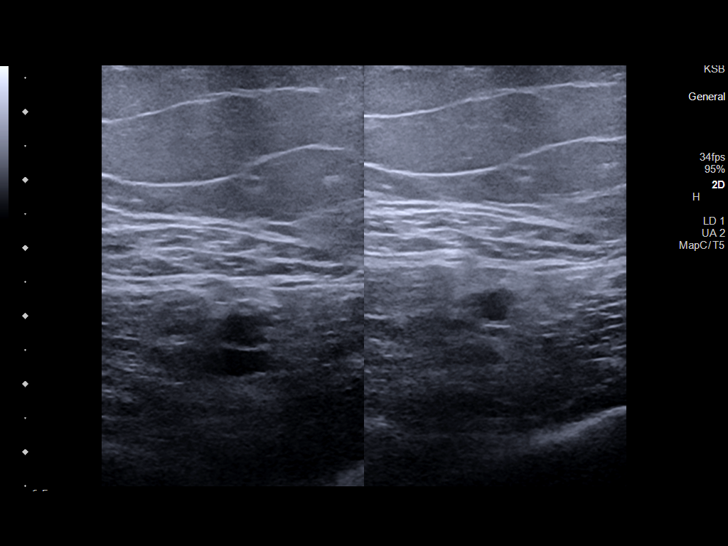
[im 30/49]
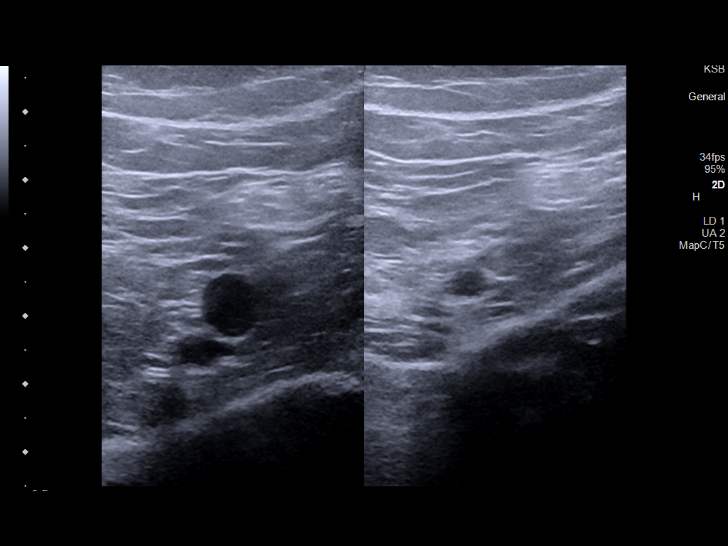
[im 34/49]
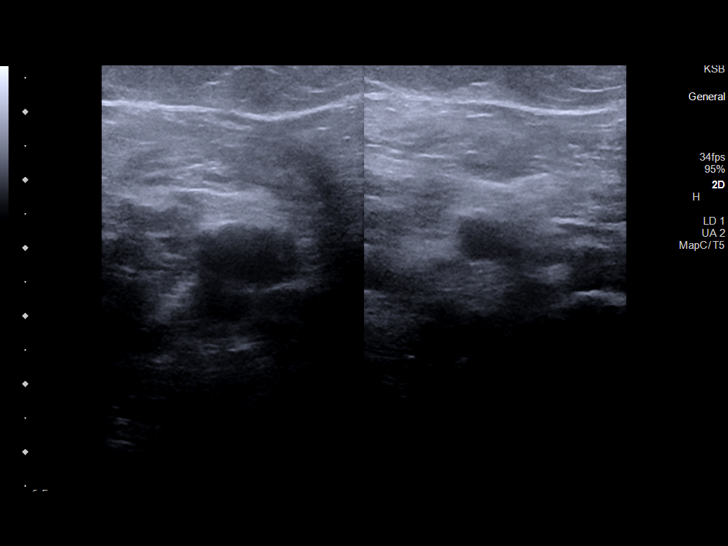
[im 38/49]
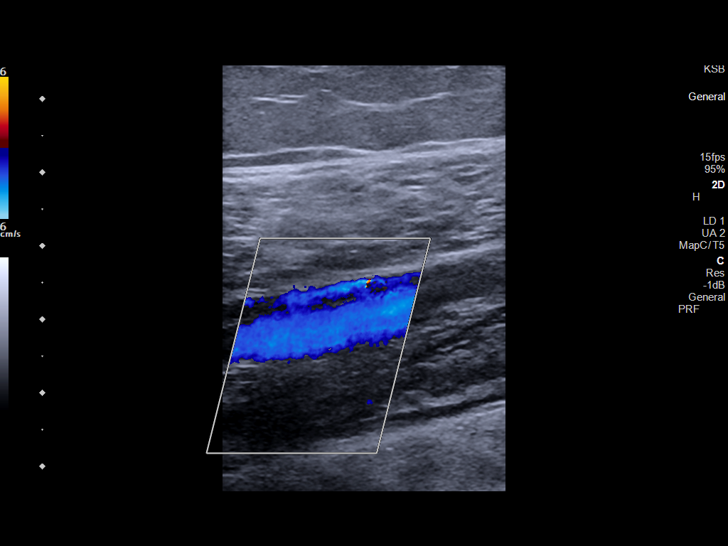
[im 40/49]
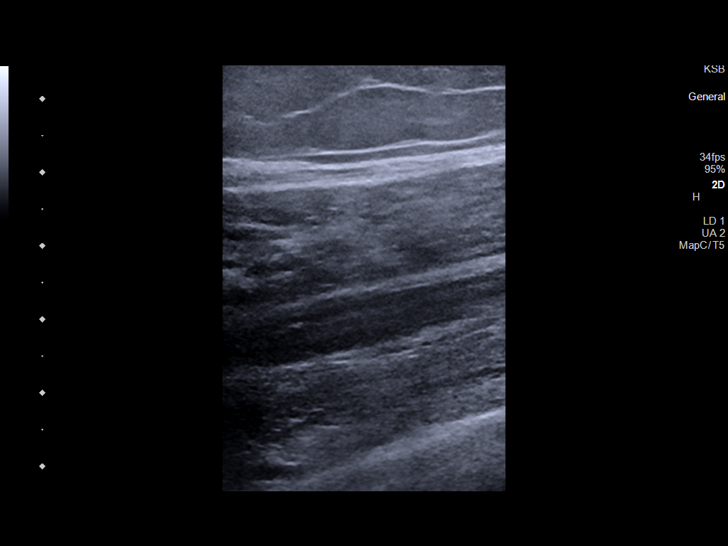
[im 44/49]
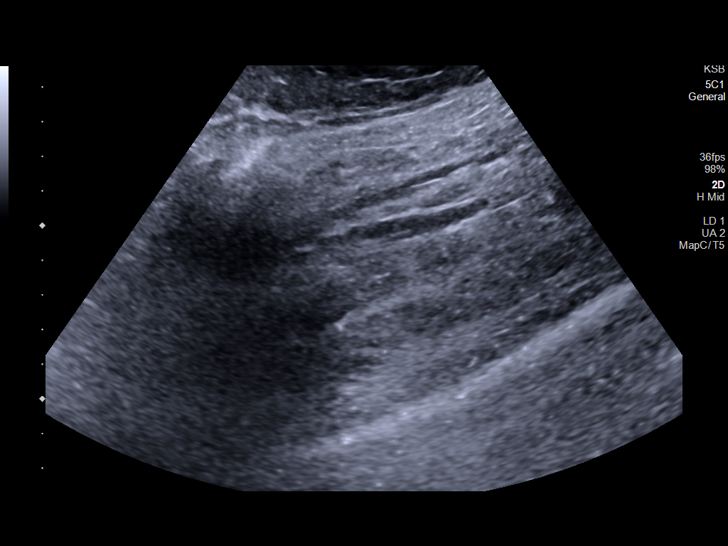
[im 49/49]
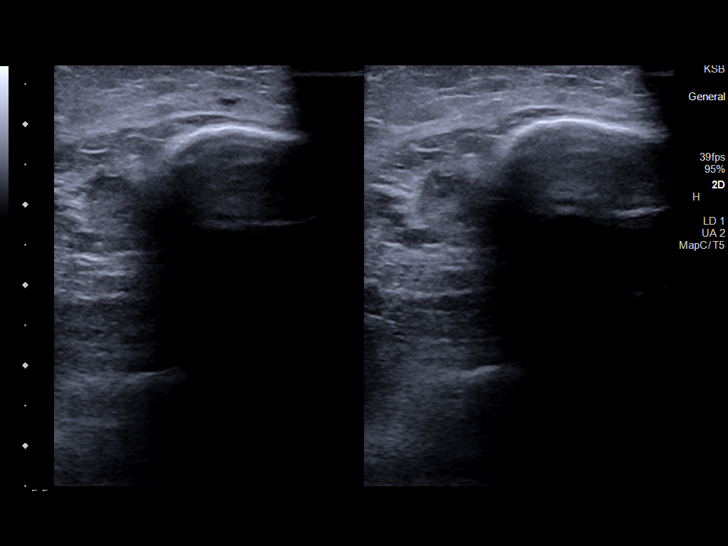

[14 of 24 positions shown; findings below may reference images not displayed]

FINDINGS: VENOUS

Normal compressibility of the common femoral, superficial femoral,
and popliteal veins, as well as the visualized calf veins.
Visualized portions of profunda femoral vein and great saphenous
vein unremarkable. No filling defects to suggest DVT on grayscale or
color Doppler imaging. Doppler waveforms show normal direction of
venous flow, normal respiratory plasticity and response to
augmentation.

Limited views of the contralateral common femoral vein are
unremarkable.

OTHER

None.

Limitations: none
IMPRESSION: Negative.

## 2020-07-26 IMAGING — CT CT ANGIO AOBIFEM WO/W CM
2 series · 10 of 16 positions shown, 11 images · IV contrast (APPLIED)
Comparison: CT dated [DATE]
COMPARISON: CT dated [DATE]

Addendum:
CLINICAL DATA: Left leg pain and swelling.

EXAM:
CT ANGIOGRAPHY OF ABDOMINAL AORTA WITH ILIOFEMORAL RUNOFF
TECHNIQUE: Multidetector CT imaging of the abdomen, pelvis and lower
extremities was performed using the standard protocol during bolus
administration of intravenous contrast. Multiplanar CT image
reconstructions and MIPs were obtained to evaluate the vascular
anatomy.
CONTRAST:  100mL OMNIPAQUE IOHEXOL 350 MG/ML SOLN

[Series 5: arterial · axial · arterial · 0.85mm/px · z∈[+444,+878]mm · 2 of 623 slices shown, 3 images]
[im 208/623  soft-tissue]
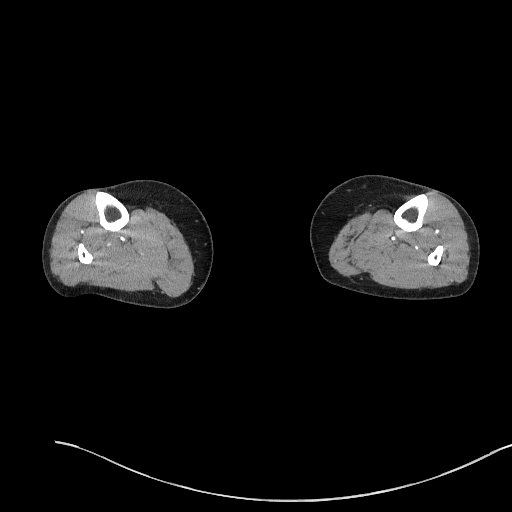
[im 208/623  bone]
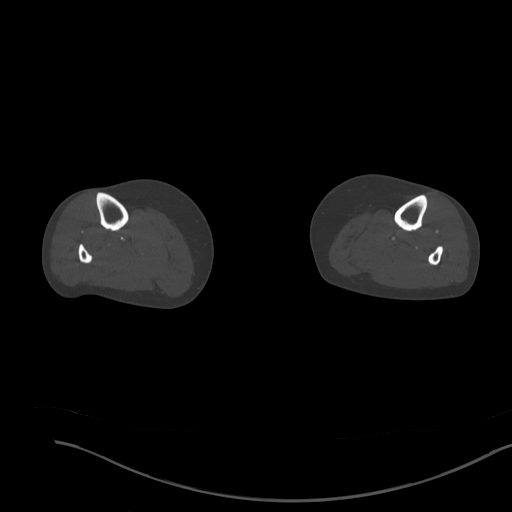
[im 415/623  soft-tissue]
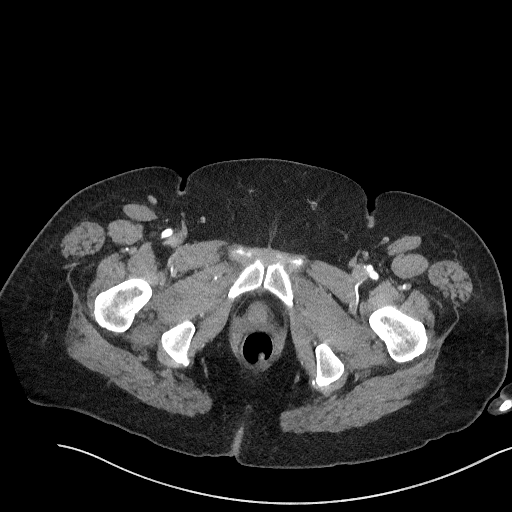

[Series 6: arterial thins · axial · arterial · 0.85mm/px · z∈[+145,+1180]mm · 8 of 1937 slices shown]
[im 177/1937  soft-tissue]
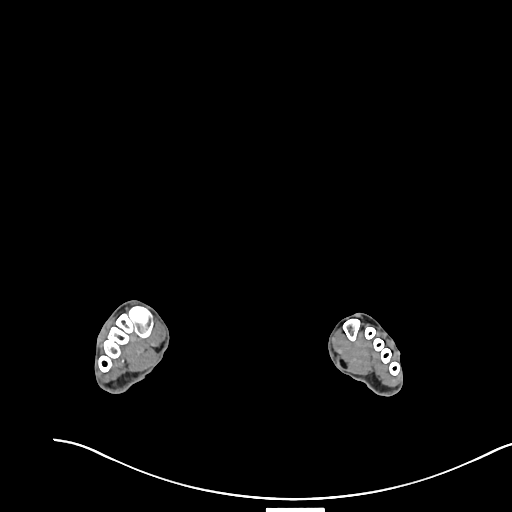
[im 353/1937  soft-tissue]
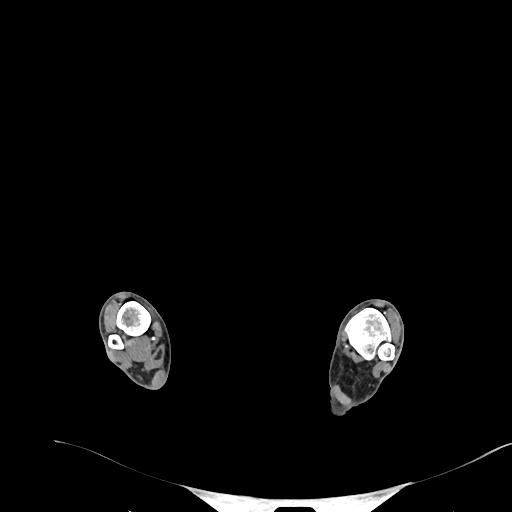
[im 705/1937  soft-tissue]
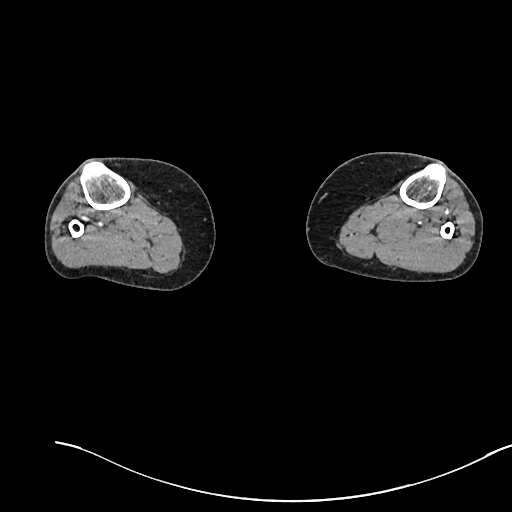
[im 881/1937  soft-tissue]
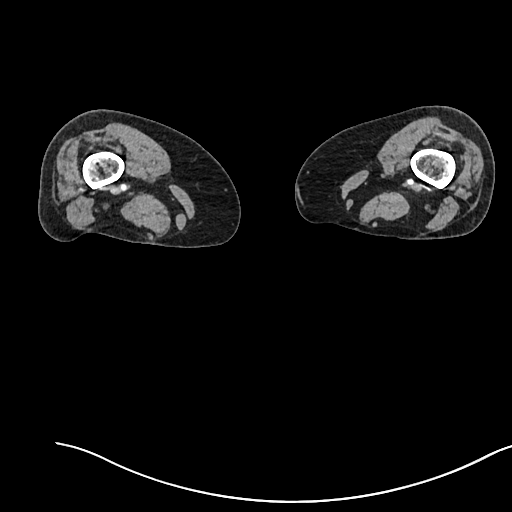
[im 1057/1937  soft-tissue]
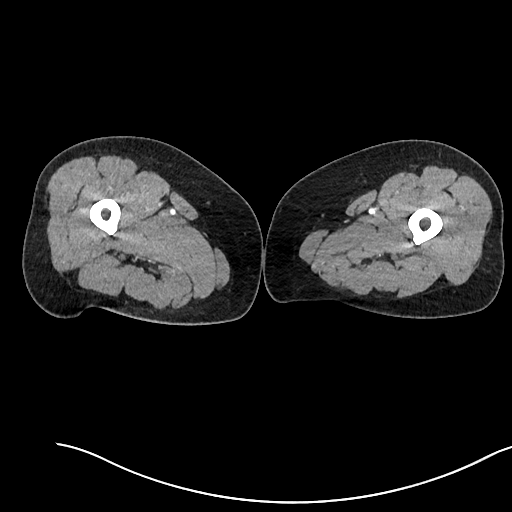
[im 1233/1937  soft-tissue]
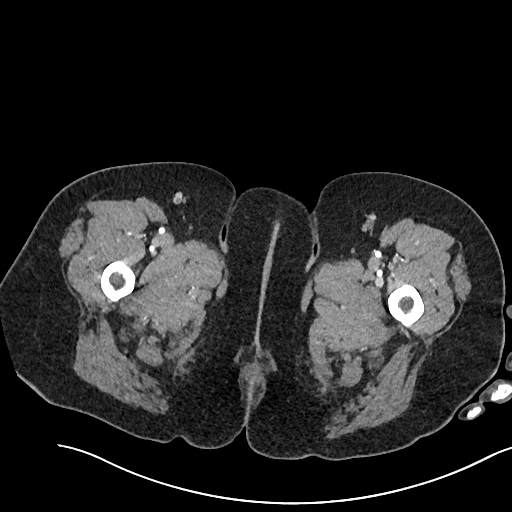
[im 1585/1937  soft-tissue]
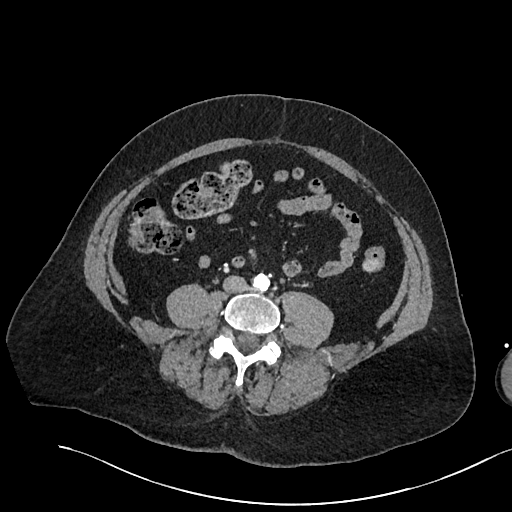
[im 1761/1937  soft-tissue]
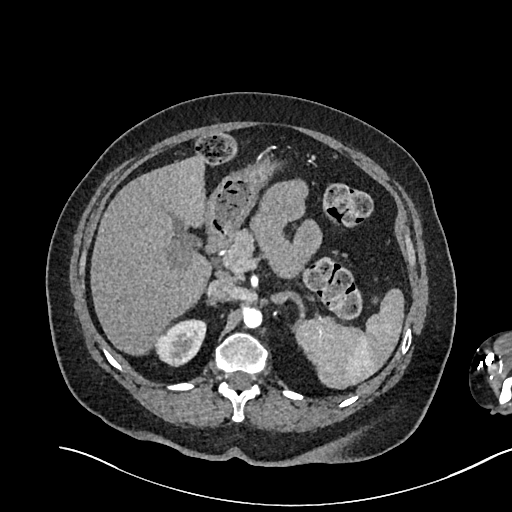

[10 of 16 positions shown; findings below may reference images not displayed]

FINDINGS: VASCULAR

Aorta: There are atherosclerotic changes of the abdominal aorta
without evidence for an aneurysm or occlusion.

Celiac: The celiac axis is widely patent. There is a new 6 mm
mycotic aneurysm at the splenic hilum.

SMA: Again noted is an irregular appearance of the mid to distal
SMA, similar to prior study. Multiple mycotic aneurysms are again
noted. These appear to be essentially stable from prior study. There
is occlusion of portions of the distal SMA with reconstitution
distally.

Renals: Both renal arteries are patent without evidence of aneurysm,
dissection, vasculitis, fibromuscular dysplasia or significant
stenosis.

IMA: Patent without evidence of aneurysm, dissection, vasculitis or
significant stenosis.

RIGHT Lower Extremity

Inflow: Common, internal and external iliac arteries are patent
without evidence of aneurysm, dissection, vasculitis or significant
stenosis.

Outflow: Common, superficial and profunda femoral arteries and the
popliteal artery are patent without evidence of aneurysm,
dissection, vasculitis or significant stenosis.

Runoff: Patent three vessel runoff to the ankle.

LEFT Lower Extremity

Inflow: Common, internal and external iliac arteries are patent
without evidence of aneurysm, dissection, vasculitis or significant
stenosis.

Outflow: There is complete occlusion of the left popliteal artery at
the level of the knee. There is occlusion of the tibioperoneal
trunk. There is reconstitution more distally.

Runoff: There is a high-grade stenosis of the proximal anterior
tibial artery. There is a 2 vessel runoff to the level of the ankle
via the anterior tibial and posterior tibial arteries. The peroneal
artery is difficult to appreciate at the level of the of the ankle.

Veins: No obvious venous abnormality within the limitations of this
arterial phase study.

Review of the MIP images confirms the above findings.

NON-VASCULAR

Lower chest: The lung bases are clear. The heart size is normal.

Hepatobiliary: Again noted is a thrombosed pseudoaneurysm arising
from the right hepatic artery. This appears to be smaller in size
when compared to recent prior study. Normal gallbladder.There is no
biliary ductal dilation.

Pancreas: Normal contours without ductal dilatation. No
peripancreatic fluid collection.

Spleen: There is heterogeneous enhancement of the spleen. The
splenic artery is significantly attenuated with compared to the
prior study. Additionally, there is a new pseudoaneurysm measuring
approximately 6 mm near the splenic hilum (axial series 5, image
48).

Adrenals/Urinary Tract:

--Adrenal glands: Unremarkable.

--Right kidney/ureter: No hydronephrosis or radiopaque kidney
stones.

--Left kidney/ureter: Is a nonobstructing stone in the lower pole
the left kidney

--Urinary bladder: Unremarkable.

Stomach/Bowel:

--Stomach/Duodenum: No hiatal hernia or other gastric abnormality.
Normal duodenal course and caliber.

--Small bowel: Unremarkable.

--Colon: Unremarkable.

--Appendix: Normal.

Lymphatic:

--No retroperitoneal lymphadenopathy.

--No mesenteric lymphadenopathy.

--No pelvic or inguinal lymphadenopathy.

Reproductive: Status post hysterectomy. No adnexal mass.

Other: No ascites or free air. The abdominal wall is normal.

Musculoskeletal. No acute displaced fractures. There are small
bilateral suprapatellar joint effusions.
IMPRESSION: 1. Complete occlusion of the left popliteal artery at the level of
the knee. There is reconstitution more distally. There is a 2 vessel
runoff to the level of the left ankle via the anterior tibial and
posterior tibial arteries. There is a high-grade stenosis of the
proximal anterior tibial artery. Findings are concerning for an
embolic event.
2. Again noted are mycotic aneurysms involving the SMA, similar to
prior study.
3. New 6 mm mycotic aneurysm at the splenic hilum. The distal
splenic artery is significantly attenuated, concerning for a
possible embolic event.
4. Interval decrease in size of thrombosed pseudoaneurysm arising
from the right hepatic artery.
5. Nonobstructing stone in the lower pole of the left kidney.

Aortic Atherosclerosis ([I1]-[I1]).

ADDENDUM:
These results were called by telephone at the time of interpretation
on [DATE] at [DATE] to provider CAROLA , who verbally
acknowledged these results.

*** End of Addendum ***
FINDINGS: VASCULAR

Aorta: There are atherosclerotic changes of the abdominal aorta
without evidence for an aneurysm or occlusion.

Celiac: The celiac axis is widely patent. There is a new 6 mm
mycotic aneurysm at the splenic hilum.

SMA: Again noted is an irregular appearance of the mid to distal
SMA, similar to prior study. Multiple mycotic aneurysms are again
noted. These appear to be essentially stable from prior study. There
is occlusion of portions of the distal SMA with reconstitution
distally.

Renals: Both renal arteries are patent without evidence of aneurysm,
dissection, vasculitis, fibromuscular dysplasia or significant
stenosis.

IMA: Patent without evidence of aneurysm, dissection, vasculitis or
significant stenosis.

RIGHT Lower Extremity

Inflow: Common, internal and external iliac arteries are patent
without evidence of aneurysm, dissection, vasculitis or significant
stenosis.

Outflow: Common, superficial and profunda femoral arteries and the
popliteal artery are patent without evidence of aneurysm,
dissection, vasculitis or significant stenosis.

Runoff: Patent three vessel runoff to the ankle.

LEFT Lower Extremity

Inflow: Common, internal and external iliac arteries are patent
without evidence of aneurysm, dissection, vasculitis or significant
stenosis.

Outflow: There is complete occlusion of the left popliteal artery at
the level of the knee. There is occlusion of the tibioperoneal
trunk. There is reconstitution more distally.

Runoff: There is a high-grade stenosis of the proximal anterior
tibial artery. There is a 2 vessel runoff to the level of the ankle
via the anterior tibial and posterior tibial arteries. The peroneal
artery is difficult to appreciate at the level of the of the ankle.

Veins: No obvious venous abnormality within the limitations of this
arterial phase study.

Review of the MIP images confirms the above findings.

NON-VASCULAR

Lower chest: The lung bases are clear. The heart size is normal.

Hepatobiliary: Again noted is a thrombosed pseudoaneurysm arising
from the right hepatic artery. This appears to be smaller in size
when compared to recent prior study. Normal gallbladder.There is no
biliary ductal dilation.

Pancreas: Normal contours without ductal dilatation. No
peripancreatic fluid collection.

Spleen: There is heterogeneous enhancement of the spleen. The
splenic artery is significantly attenuated with compared to the
prior study. Additionally, there is a new pseudoaneurysm measuring
approximately 6 mm near the splenic hilum (axial series 5, image
48).

Adrenals/Urinary Tract:

--Adrenal glands: Unremarkable.

--Right kidney/ureter: No hydronephrosis or radiopaque kidney
stones.

--Left kidney/ureter: Is a nonobstructing stone in the lower pole
the left kidney

--Urinary bladder: Unremarkable.

Stomach/Bowel:

--Stomach/Duodenum: No hiatal hernia or other gastric abnormality.
Normal duodenal course and caliber.

--Small bowel: Unremarkable.

--Colon: Unremarkable.

--Appendix: Normal.

Lymphatic:

--No retroperitoneal lymphadenopathy.

--No mesenteric lymphadenopathy.

--No pelvic or inguinal lymphadenopathy.

Reproductive: Status post hysterectomy. No adnexal mass.

Other: No ascites or free air. The abdominal wall is normal.

Musculoskeletal. No acute displaced fractures. There are small
bilateral suprapatellar joint effusions.
IMPRESSION: 1. Complete occlusion of the left popliteal artery at the level of
the knee. There is reconstitution more distally. There is a 2 vessel
runoff to the level of the left ankle via the anterior tibial and
posterior tibial arteries. There is a high-grade stenosis of the
proximal anterior tibial artery. Findings are concerning for an
embolic event.
2. Again noted are mycotic aneurysms involving the SMA, similar to
prior study.
3. New 6 mm mycotic aneurysm at the splenic hilum. The distal
splenic artery is significantly attenuated, concerning for a
possible embolic event.
4. Interval decrease in size of thrombosed pseudoaneurysm arising
from the right hepatic artery.
5. Nonobstructing stone in the lower pole of the left kidney.

Aortic Atherosclerosis ([I1]-[I1]).

## 2020-07-28 IMAGING — CT CT HEART MORP W/ CTA COR W/ SCORE W/ CA W/CM &/OR W/O CM
4 of 7 series · 8 of 20 positions shown, 9 images · non-contrast
Comparison: CT chest [DATE]

Addendum:
CLINICAL DATA: 64 year old Caucasian Female

EXAM:
Cardiac/Coronary  CTA
TECHNIQUE: The patient was scanned on a [employer] scanner.

[Series 6: best diast 71 % · axial · 0.44mm/px · z∈[+1279,+1318]mm · 2 of 293 slices shown, 3 images]
[im 98/293  vessel]
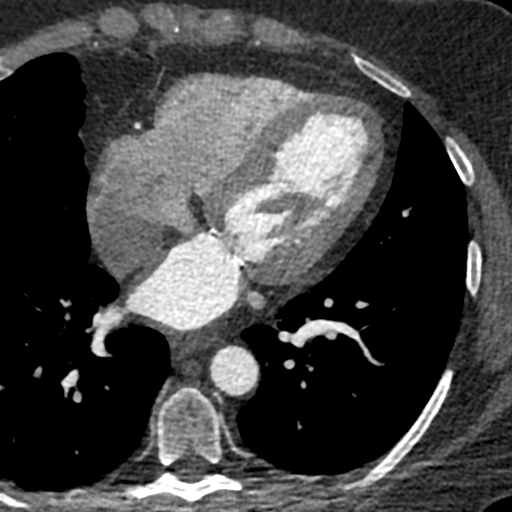
[im 98/293  lung]
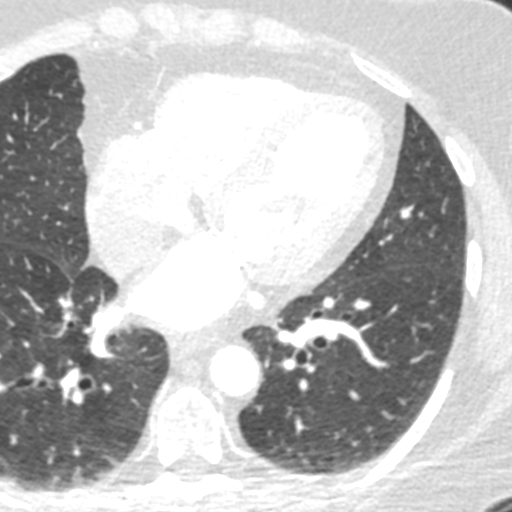
[im 195/293  vessel]
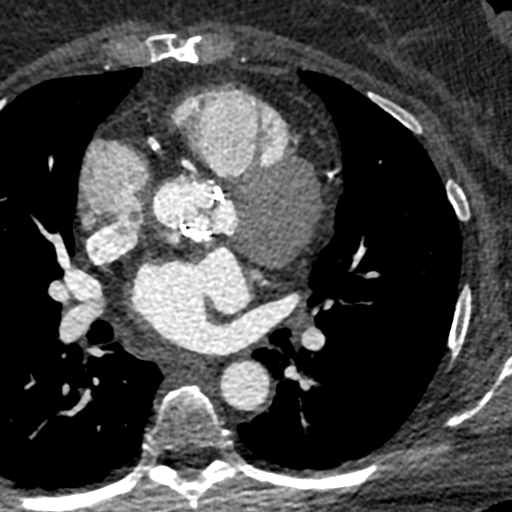

[Series 7: best syst 46 % · axial · 0.44mm/px · z∈[+1279,+1318]mm · 2 of 293 slices shown]
[im 98/293  vessel]
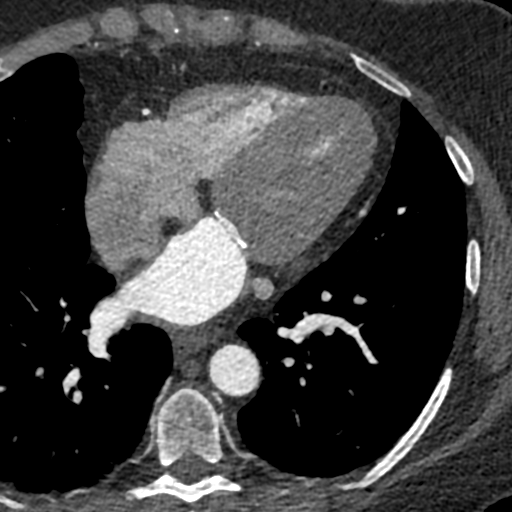
[im 195/293  vessel]
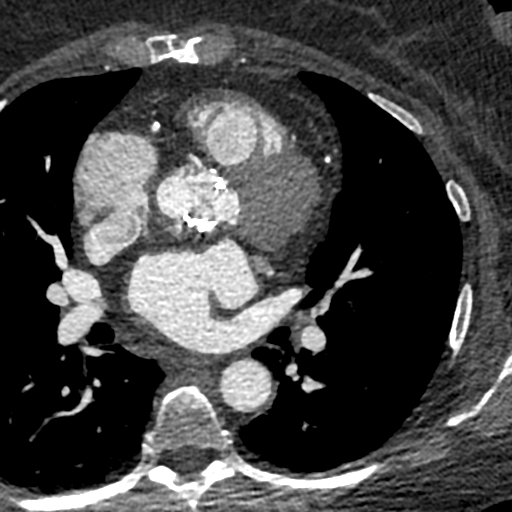

[Series 8: ts diast 71 % · axial · 0.44mm/px · z∈[+1279,+1318]mm · 2 of 293 slices shown]
[im 98/293  vessel]
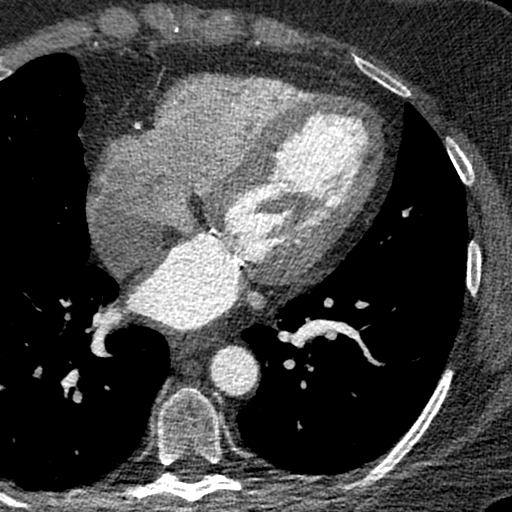
[im 195/293  vessel]
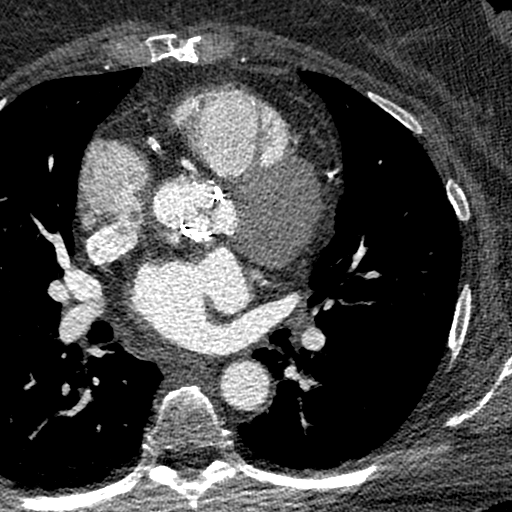

[Series 9: ts syst 46 % · axial · 0.44mm/px · z∈[+1279,+1318]mm · 2 of 293 slices shown]
[im 98/293  vessel]
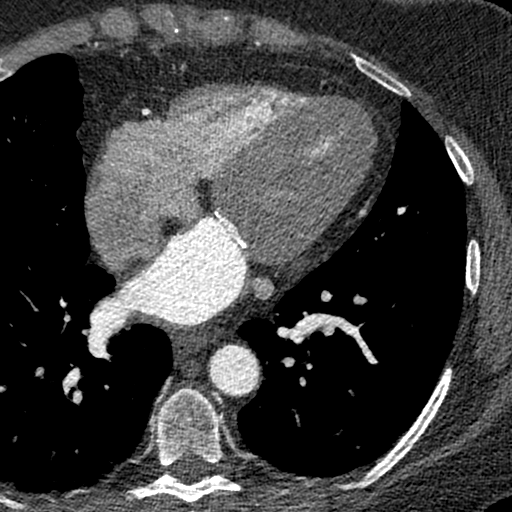
[im 195/293  vessel]
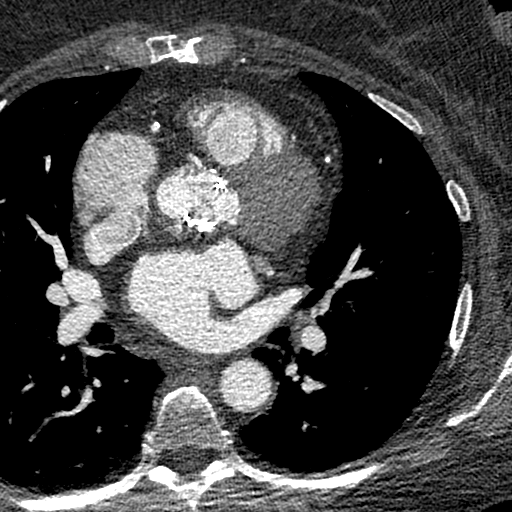

[8 of 20 positions shown; findings below may reference images not displayed]



Aorta:  Normal size.  Aortic atherosclerosis noted.

There is a 76 Hounsfield abnormal nodular hypo-attenuation in the
ascending aorta.

Aortic Valve: Stented pericardial valve 21 mm. There is contrast
extravasation occurring at the prosthetic right coronary cusps that
could be concerning for small pseudoaneurysm and associated abscess.
There is thickening of the prosthetic valve at the right cusp with a
150 Hounsfield that may be consistent with prior vegetation.

LVOT calcification: Mild with no evidence of LVOT abscess or
pseudoaneurysm.

Annular calcification: None

Aortic Valve Calcium Score: NA (prosthetic valve)

Major annulus diameter: 27 mm

Minor annulus diameter:23 mm

Coronary Arteries:  Normal coronary origin.  Right dominance.

Coronary Calcium Score:

Left main: 90

Left anterior descending artery: 288

Left circumflex artery: 913

Right coronary artery: 904

Total: [VY]

Percentile: 99th for age, sex, and race matched control.

Nitroglycerin not administered-study is not for coronary assessment

Other findings:

Atypical pulmonary vein drainage into the left atrium: Presence of a
right middle pulmonary vein.

Normal left atrial appendage without a thrombus.

Normal size of the pulmonary artery.

Severe left atrial enlargement.

Mitral Annular calcification noted.

Extra-cardiac findings: See attached radiology report for
non-cardiac structures.
IMPRESSION: 1. There is a 76 Hounsfield abnormal nodular hypo-attenuation in the
ascending aorta. Stented pericardial valve 21 mm. There is contrast
extravasation occurring at the prosthetic right coronary cusps that
could be concerning for small pseudoaneurysm and associated abscess.
There is thickening of the prosthetic valve at the right cusp with a
150 Hounsfield that may be consistent with prior vegetation: In the
correct clinical context this is consistent with prior prosthetic
infective endocarditis and abscess. Discussed with cardiac CT
director who also reviewed findings. Discussed with primary
cardiologist.

2. Coronary calcium score of [VY]. This was 99th percentile for age,
sex, and race matched control.

3.  Normal coronary origin with Right dominance.

4. Atypical pulmonary vein drainage into the left atrium: Presence
of a right middle pulmonary vein.

5. Aortic atherosclerosis noted.

EXAM:
OVER-READ INTERPRETATION  CT CHEST

The following report is an over-read performed by radiologist Dr.
does not include interpretation of cardiac or coronary anatomy or
pathology. The coronary CTA interpretation by the cardiologist is
attached.
FINDINGS: Mediastinum/nodes: Prominent right paratracheal lymph node measures
1.1 cm and does not meet CT criteria for adenopathy. No enlarged
mediastinal or hilar lymph nodes identified.

Lungs/pleura: No pleural effusion identified. No airspace
consolidation, atelectasis or pneumothorax. No suspicious lung
nodules.

Upper abdomen: No acute abnormality within the imaged portions of
the upper abdomen.

Musculoskeletal: Signs of previous median sternotomy. No acute or
suspicious osseous findings.
IMPRESSION: 1. No significant non-cardiac supplemental findings.

*** End of Addendum ***
FINDINGS: A 100 kV prospective scan was triggered in the descending thoracic
aorta at 111 HU's. Axial non-contrast 3 mm slices were carried out
through the heart. The data set was analyzed on a dedicated work
station and scored using the Agatson method. Gantry rotation speed
was 250 msecs and collimation was .6 mm. No beta blockade and 0.8 mg
of sl NTG was given. The 3D data set was reconstructed in 5%
intervals of the 67-82 % of the R-R cycle. Diastolic phases were
analyzed on a dedicated work station using MPR, MIP and VRT modes.
The patient received 80 cc of contrast.

Aorta:  Normal size.  Aortic atherosclerosis noted.

There is a 76 Hounsfield abnormal nodular hypo-attenuation in the
ascending aorta.

Aortic Valve: Stented pericardial valve 21 mm. There is contrast
extravasation occurring at the prosthetic right coronary cusps that
could be concerning for small pseudoaneurysm and associated abscess.
There is thickening of the prosthetic valve at the right cusp with a
150 Hounsfield that may be consistent with prior vegetation.

LVOT calcification: Mild with no evidence of LVOT abscess or
pseudoaneurysm.

Annular calcification: None

Aortic Valve Calcium Score: NA (prosthetic valve)

Major annulus diameter: 27 mm

Minor annulus diameter:23 mm

Coronary Arteries:  Normal coronary origin.  Right dominance.

Coronary Calcium Score:

Left main: 90

Left anterior descending artery: 288

Left circumflex artery: 913

Right coronary artery: 904

Total: [VY]

Percentile: 99th for age, sex, and race matched control.

Nitroglycerin not administered-study is not for coronary assessment

Other findings:

Atypical pulmonary vein drainage into the left atrium: Presence of a
right middle pulmonary vein.

Normal left atrial appendage without a thrombus.

Normal size of the pulmonary artery.

Severe left atrial enlargement.

Mitral Annular calcification noted.

Extra-cardiac findings: See attached radiology report for
non-cardiac structures.
IMPRESSION: 1. There is a 76 Hounsfield abnormal nodular hypo-attenuation in the
ascending aorta. Stented pericardial valve 21 mm. There is contrast
extravasation occurring at the prosthetic right coronary cusps that
could be concerning for small pseudoaneurysm and associated abscess.
There is thickening of the prosthetic valve at the right cusp with a
150 Hounsfield that may be consistent with prior vegetation: In the
correct clinical context this is consistent with prior prosthetic
infective endocarditis and abscess. Discussed with cardiac CT
director who also reviewed findings. Discussed with primary
cardiologist.

2. Coronary calcium score of [VY]. This was 99th percentile for age,
sex, and race matched control.

3.  Normal coronary origin with Right dominance.

4. Atypical pulmonary vein drainage into the left atrium: Presence
of a right middle pulmonary vein.

5. Aortic atherosclerosis noted.

## 2020-09-15 IMAGING — CT CT CTA ABD/PEL W/CM AND/OR W/O CM
2 of 10 series · 12 of 46 positions shown, 17 images · IV contrast (omnipaque)
Comparison: [DATE]

CLINICAL DATA: Small saccular branch aneurysms of the distal SMA,
surveillance follow-up

EXAM:
CT ANGIOGRAPHY ABDOMEN AND PELVIS WITH CONTRAST AND WITHOUT CONTRAST
TECHNIQUE: Multidetector CT imaging of the abdomen and pelvis was performed
using the standard protocol during bolus administration of
intravenous contrast. Multiplanar reconstructed images and MIPs were
obtained and reviewed to evaluate the vascular anatomy.
CONTRAST:  100mL OMNIPAQUE IOHEXOL 350 MG/ML SOLN

[Series 8: coronal mpr · coronal · 0.80mm/px · 2 of 159 slices shown]
[im 53/159  soft-tissue]
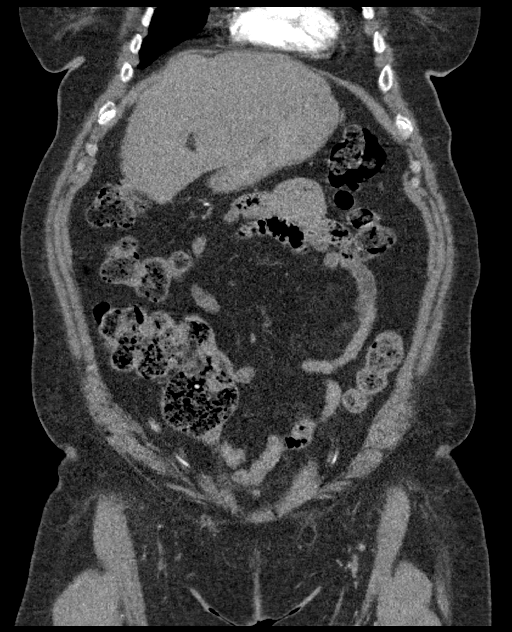
[im 106/159  soft-tissue]
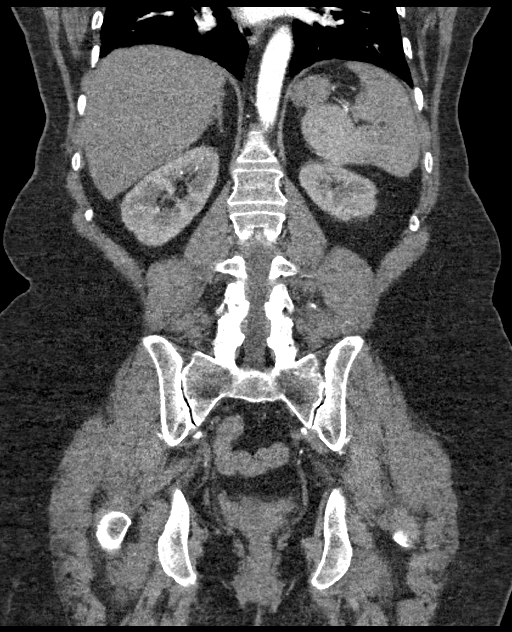

[Series 12: axial venous · axial · portal-venous · 0.78mm/px · z∈[-202,+190]mm · 10 of 236 slices shown, 15 images]
[im 20/236  soft-tissue]
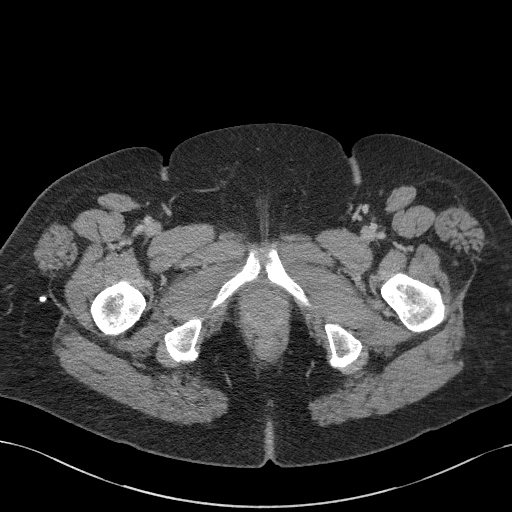
[im 20/236  bone]
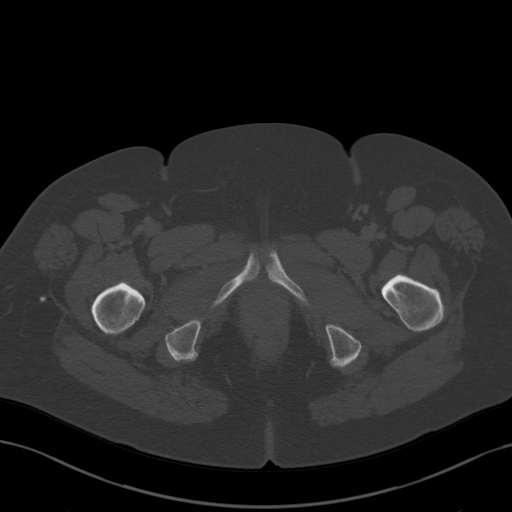
[im 40/236  soft-tissue]
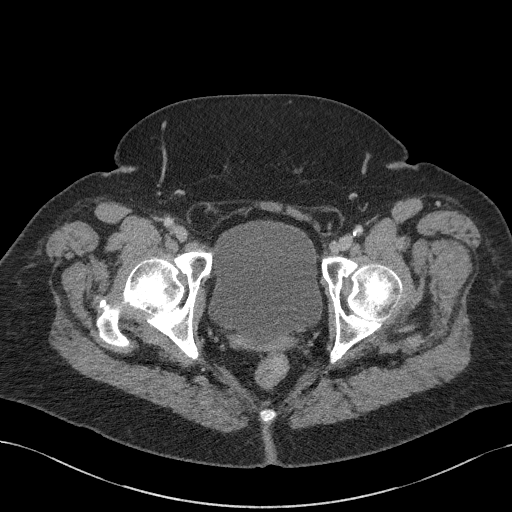
[im 79/236  soft-tissue]
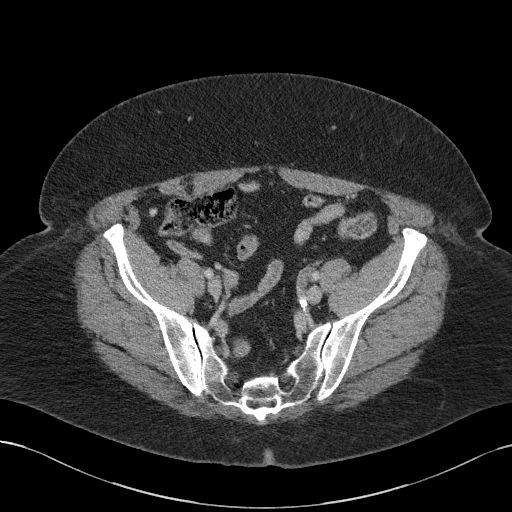
[im 98/236  soft-tissue]
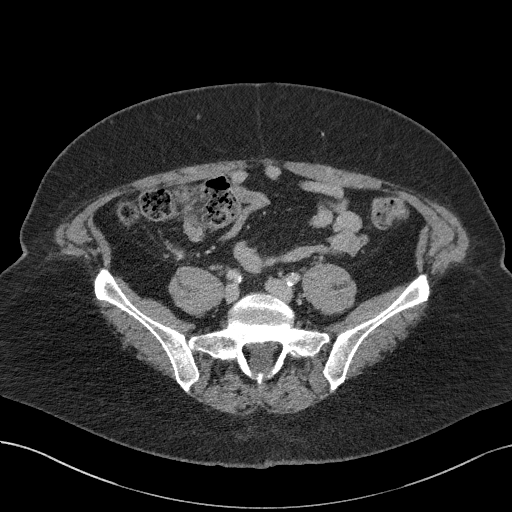
[im 118/236  soft-tissue]
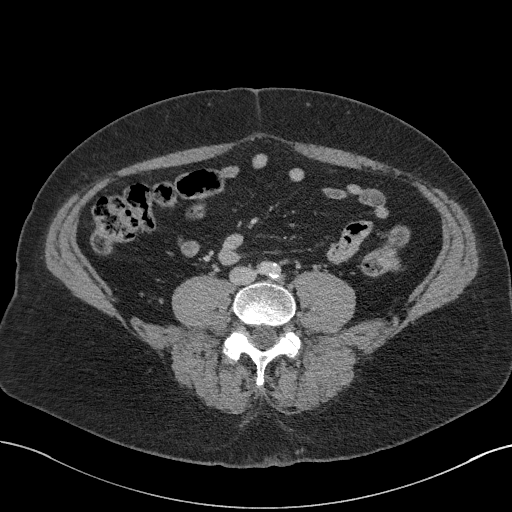
[im 138/236  soft-tissue]
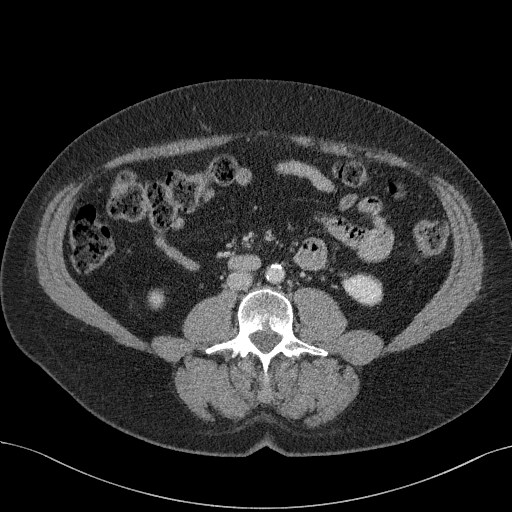
[im 157/236  soft-tissue]
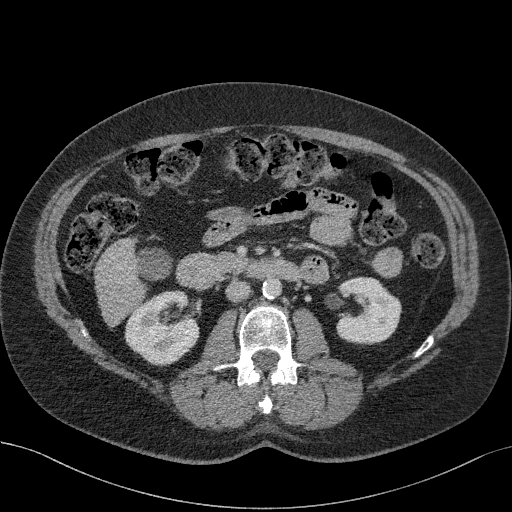
[im 157/236  lung]
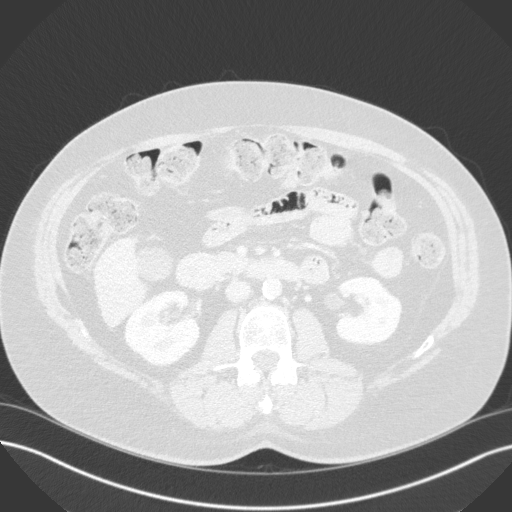
[im 177/236  lung]
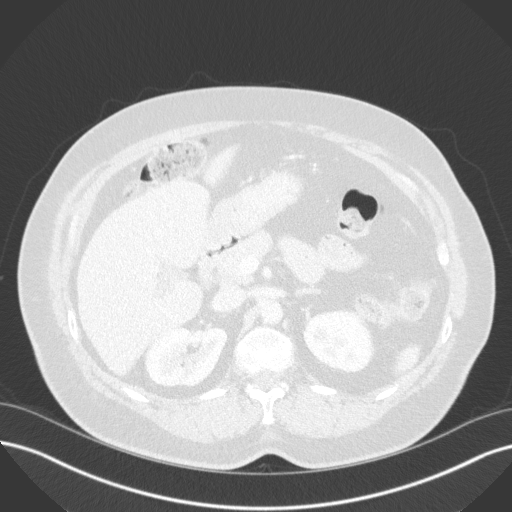
[im 196/236  soft-tissue]
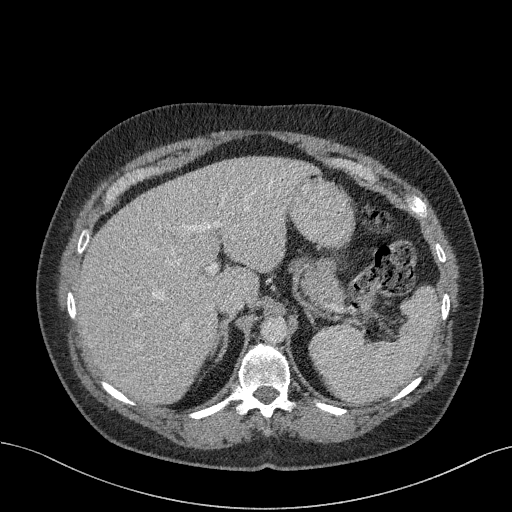
[im 196/236  lung]
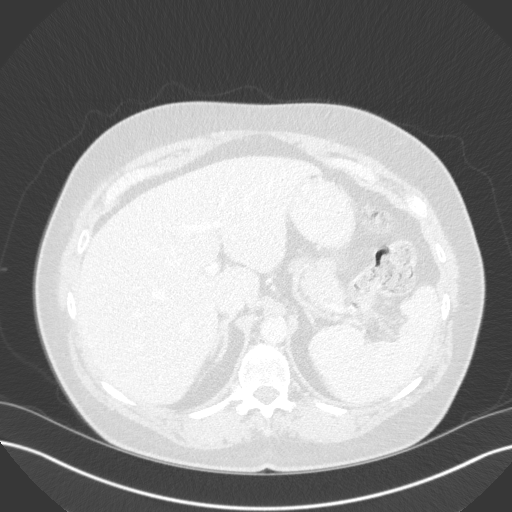
[im 216/236  soft-tissue]
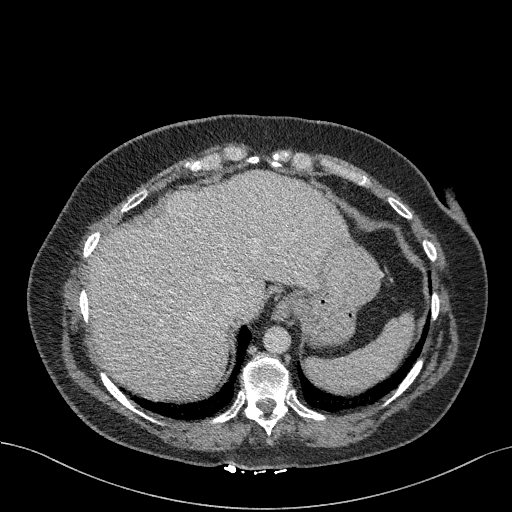
[im 216/236  lung]
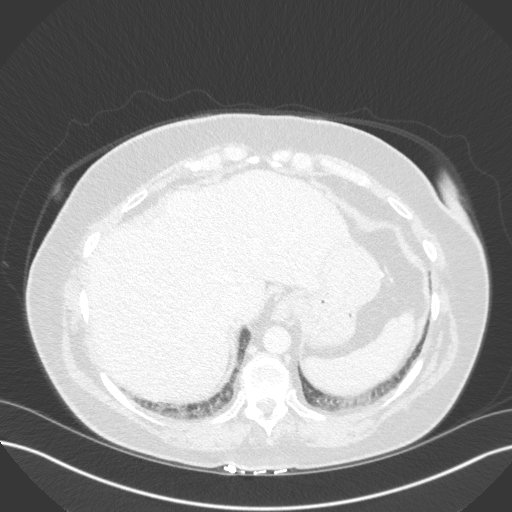
[im 216/236  bone]
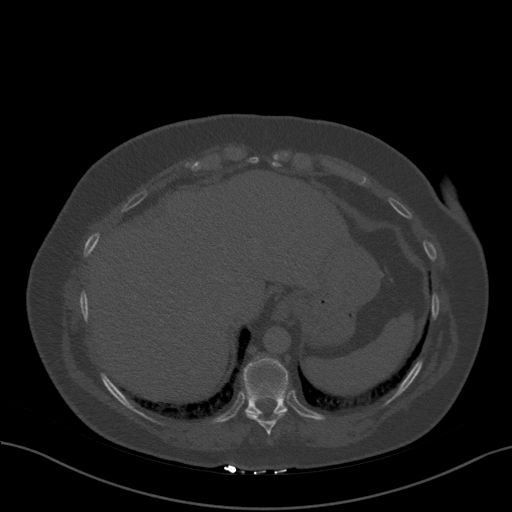

[12 of 46 positions shown; findings below may reference images not displayed]

FINDINGS: VASCULAR

Aorta: Stable atherosclerotic changes, most pronounced of the
infrarenal aorta without aneurysm, dissection or occlusive disease.
No retroperitoneal hemorrhage or hematoma. No evidence of rupture.
No surrounding inflammatory process or edema.

Celiac: Widely patent origin including its branches.

SMA: Widely patent origin. Atherosclerotic changes of the SMA trunk
in the central mesentery. Peripheral SMA branch focal saccular
aneurysm versus pseudoaneurysm appears slightly smaller measuring
6.4 mm, previously 7.5 mm. Certainly no enlarging saccular aneurysm
of the SMA branches.

Renals: Widely patent bilaterally. Accessory renal artery to the
left kidney upper pole.

IMA: Remains patent off the distal aorta including its branches. No
aneurysmal disease.

Inflow: Iliac atherosclerosis noted. No inflow disease or occlusion.
Negative for acute vascular process, aneurysm or dissection. The
common, internal and external iliac arteries all remain patent.

Proximal Outflow: Atherosclerotic changes of the patent common
femoral, proximal profunda femoral, proximal superficial femoral
arteries demonstrated.

Veins: No GARCES process.

Review of the MIP images confirms the above findings.

NON-VASCULAR

Lower chest: No acute abnormality.

Hepatobiliary: Previously described heterogeneous round hypoechoic
area adjacent to the caudate and posterior to the gallbladder in
segment 5 has decreased in size now measuring 1.6 cm, previously
cm.

No other significant hepatic abnormality. Gallbladder and biliary
tree unremarkable. No biliary dilatation. Hepatic and portal veins
are patent.

Pancreas: Unremarkable. No pancreatic ductal dilatation or
surrounding inflammatory changes.

Spleen: Normal in size without focal abnormality.

Adrenals/Urinary Tract: Normal adrenal glands. Punctate
subcentimeter nonobstructing left lower pole renal calculus noted as
before. No renal obstruction or hydronephrosis. No significant focal
renal abnormality.

No hydroureter or ureteral calculus.  Bladder unremarkable.

Stomach/Bowel: Stomach is within normal limits. Appendix appears
normal. No evidence of bowel wall thickening, distention, or
inflammatory changes.

Lymphatic: No bulky adenopathy.

Reproductive: Status post hysterectomy. No adnexal masses.

Other: No abdominal wall hernia or abnormality. No abdominopelvic
ascites.

Musculoskeletal: No acute osseous finding. Minor lower lumbar facet
arthropathy.
IMPRESSION: VASCULAR

Slight interval decrease in size of the distal SMA branch saccular
aneurysms versus pseudoaneurysms. Maximal diameter 6.5 mm,
previously 7.5 mm. No new abdominopelvic aneurysmal disease
appreciated.

Thrombosed right hepatic artery pseudoaneurysm overall decreased in
size now measuring 1.6 cm, previously 2.9 cm

Aortic Atherosclerosis ([GW]-[GW]).

NON-VASCULAR

No other acute intra-abdominal or pelvic finding. Overall stable
exam.

## 2021-02-21 IMAGING — CR DG ABDOMEN 1V
1 series · 1 of 1 positions shown · non-contrast
Comparison: Abdominal plain film, dated [DATE] and abdomen
pelvis CT dated [DATE]

CLINICAL DATA: Hematuria and pain.

EXAM:
ABDOMEN - 1 VIEW

[abdomen kub]
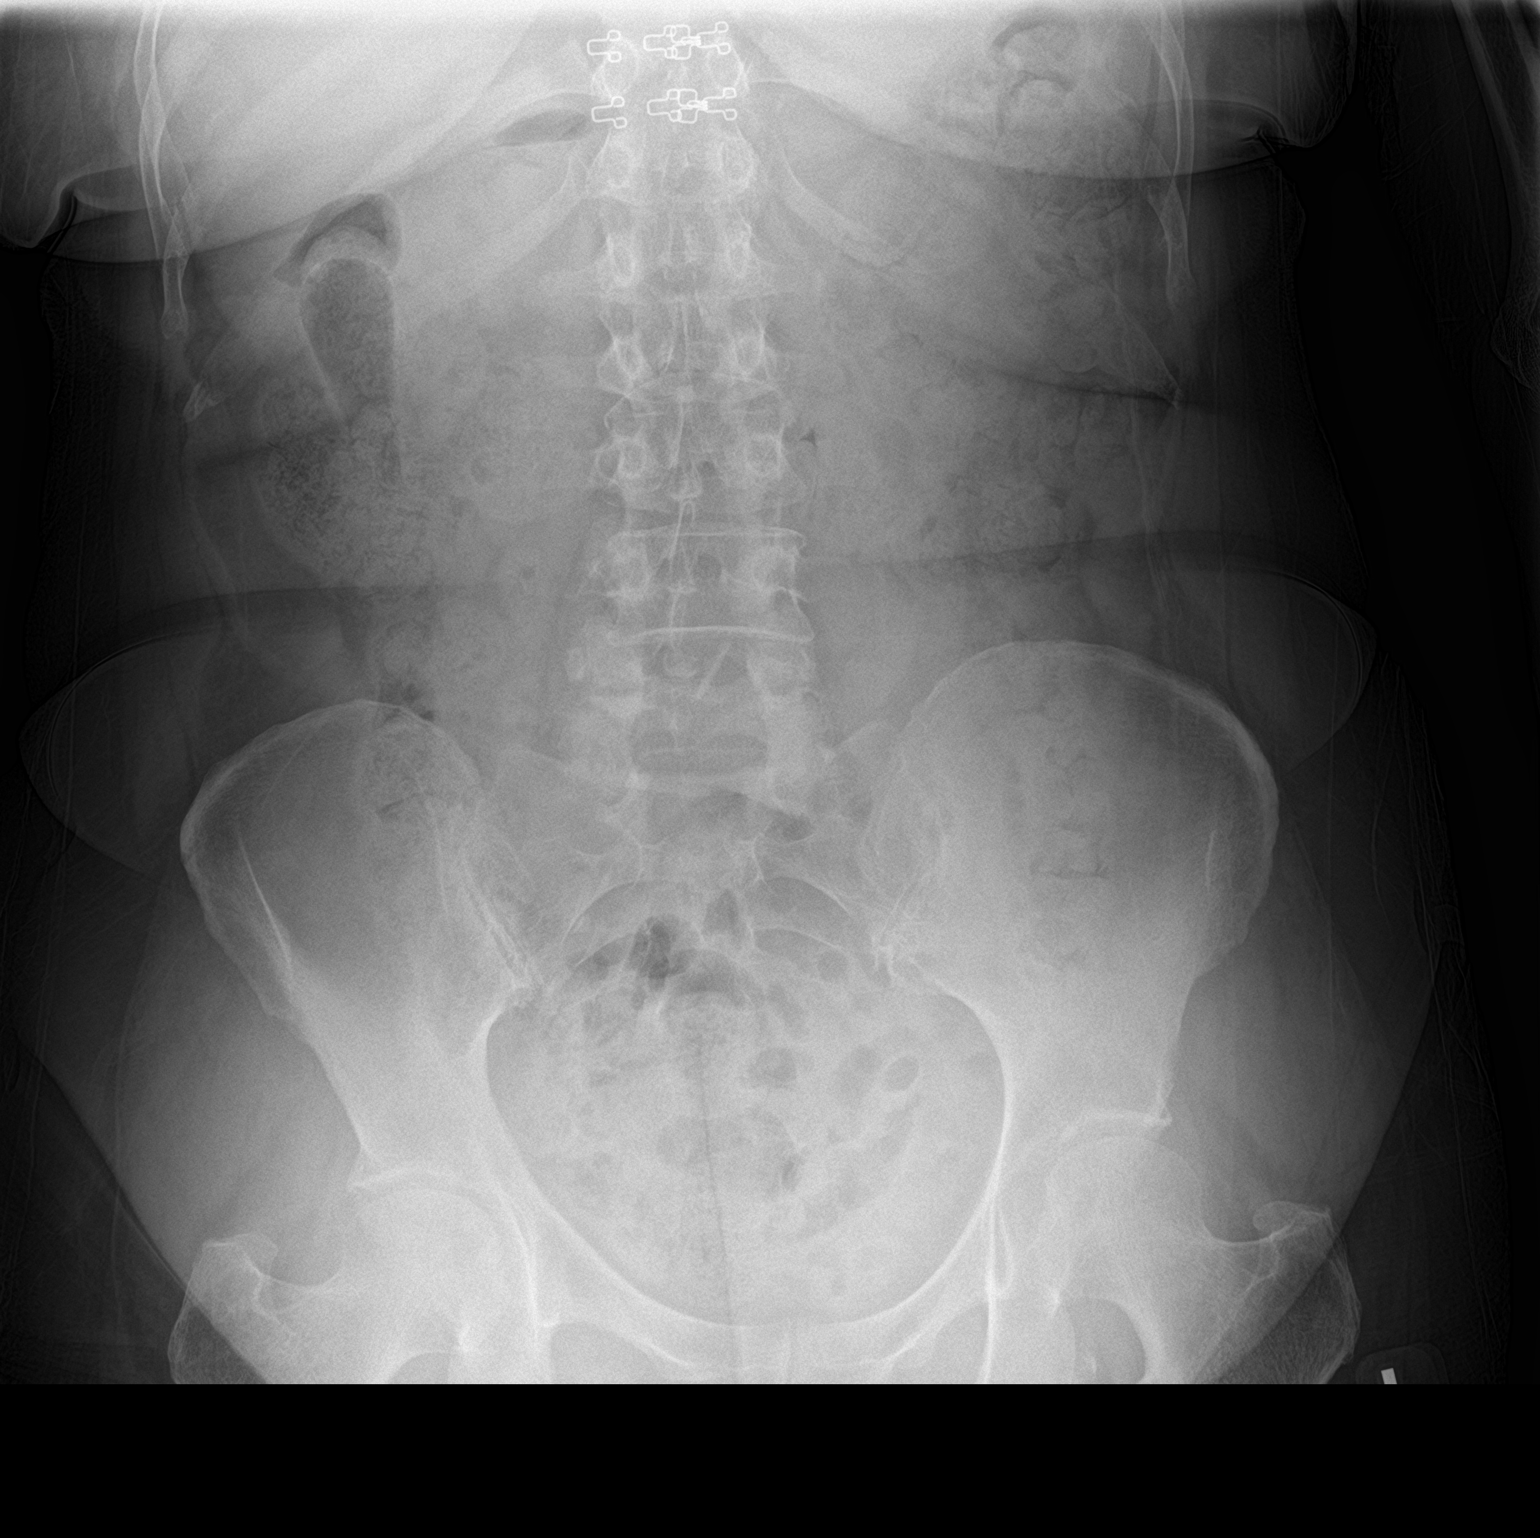

[1 of 1 positions shown; findings below may reference images not displayed]

FINDINGS: The bowel gas pattern is normal. A large amount of stool is seen
throughout the colon. No radio-opaque calculi or other significant
radiographic abnormality are seen.
IMPRESSION: Large stool burden without evidence of bowel obstruction.

## 2021-04-08 IMAGING — CT CT HEAD W/O CM
3 series · 15 of 47 positions shown, 18 images · non-contrast
Comparison: CT [DATE], MRI [DATE].

CLINICAL DATA: Memory loss

EXAM:
CT HEAD WITHOUT CONTRAST
TECHNIQUE: Contiguous axial images were obtained from the base of the skull
through the vertex without intravenous contrast.

[Series 4: head 5.0 h30s · axial · 0.41mm/px · z∈[-48,+77]mm · 9 of 30 slices shown, 12 images]
[im 3/30  brain]
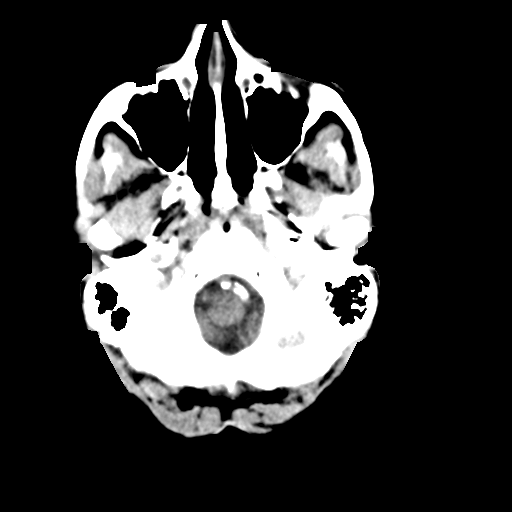
[im 3/30  bone]
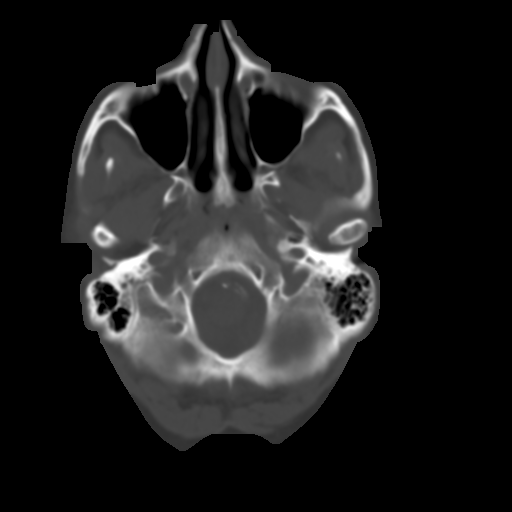
[im 6/30  brain]
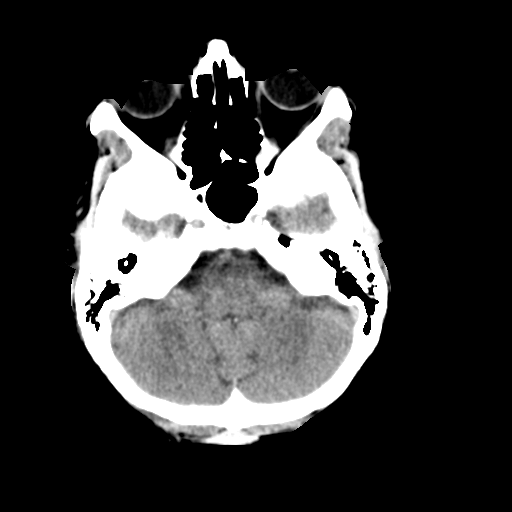
[im 9/30  brain]
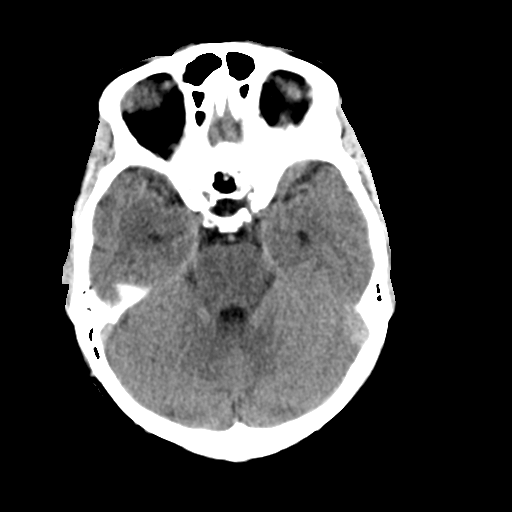
[im 12/30  brain]
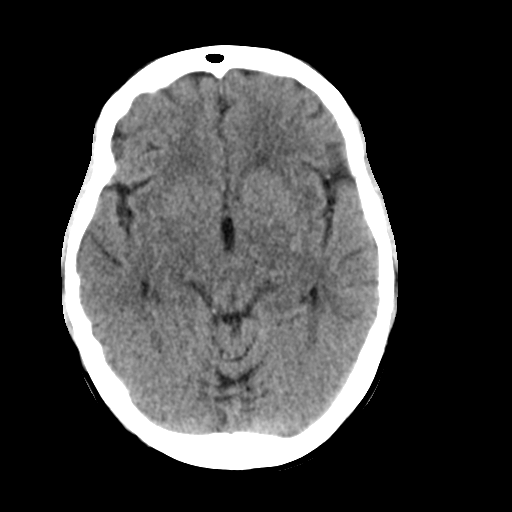
[im 16/30  brain]
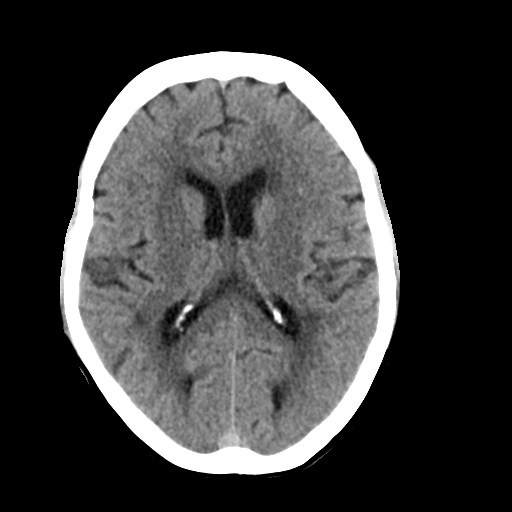
[im 16/30  bone]
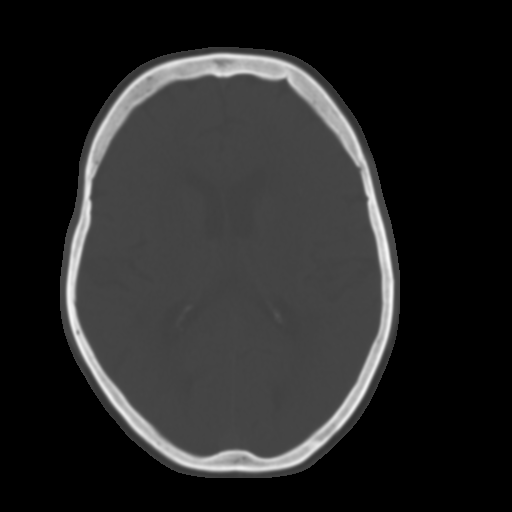
[im 19/30  brain]
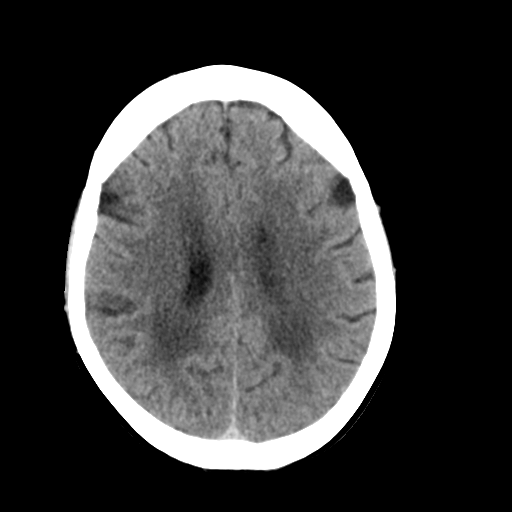
[im 22/30  brain]
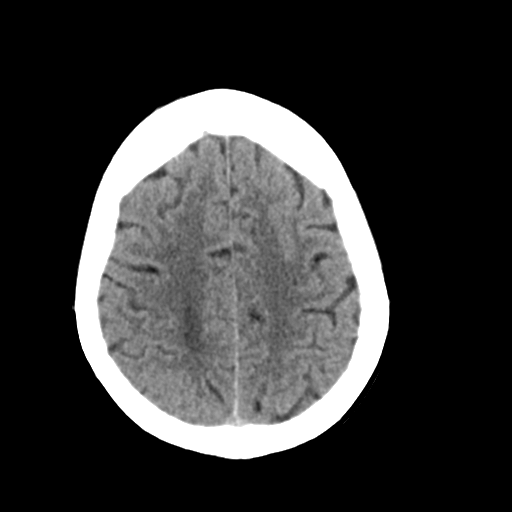
[im 25/30  brain]
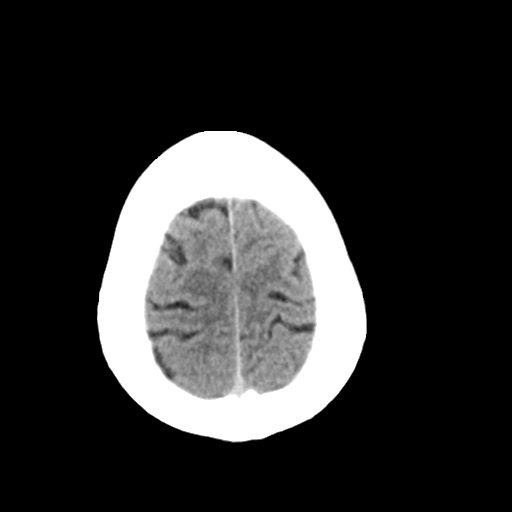
[im 28/30  brain]
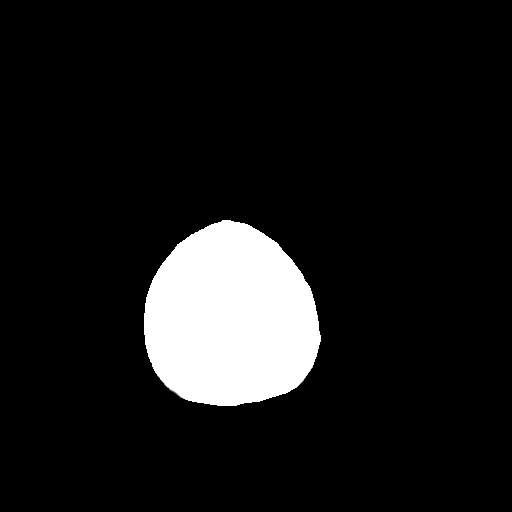
[im 28/30  bone]
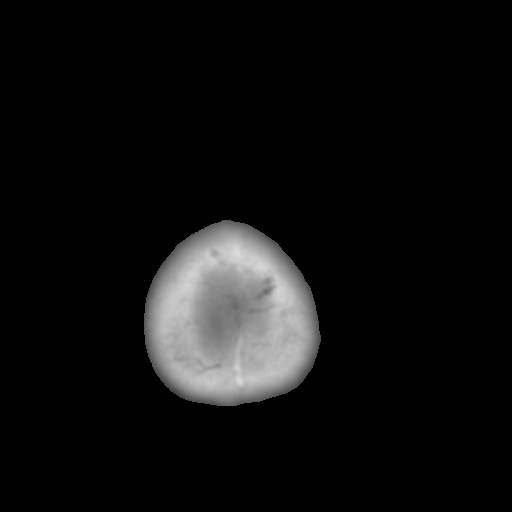

[Series 5: head 3.0 mpr cor · coronal · 0.31mm/px · 3 of 66 slices shown]
[im 22/66  brain]
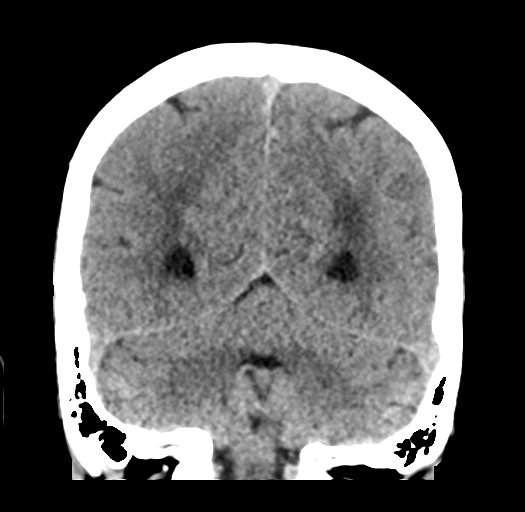
[im 29/66  brain]
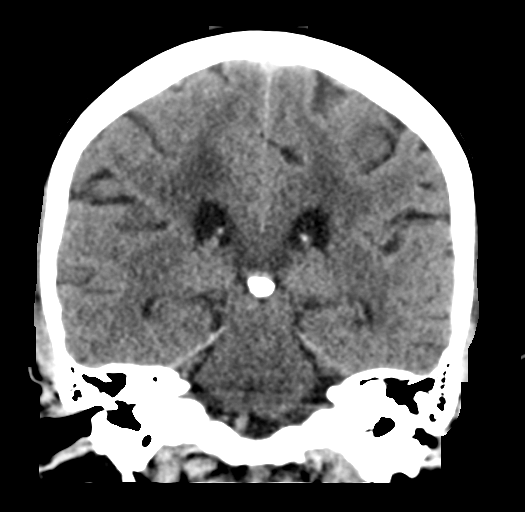
[im 37/66  brain]
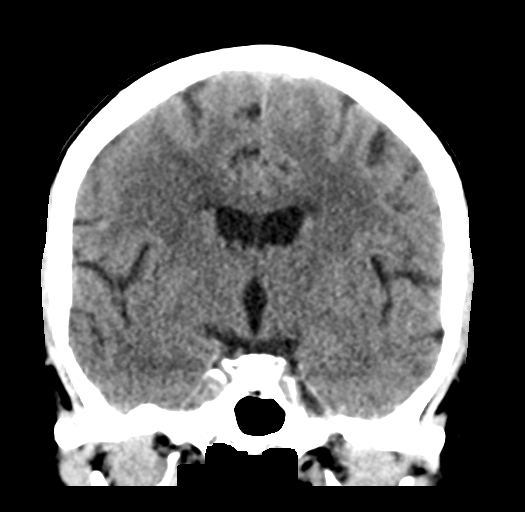

[Series 6: head 3.0 mpr sag · sagittal · 0.30mm/px · 3 of 55 slices shown]
[im 19/55  brain]
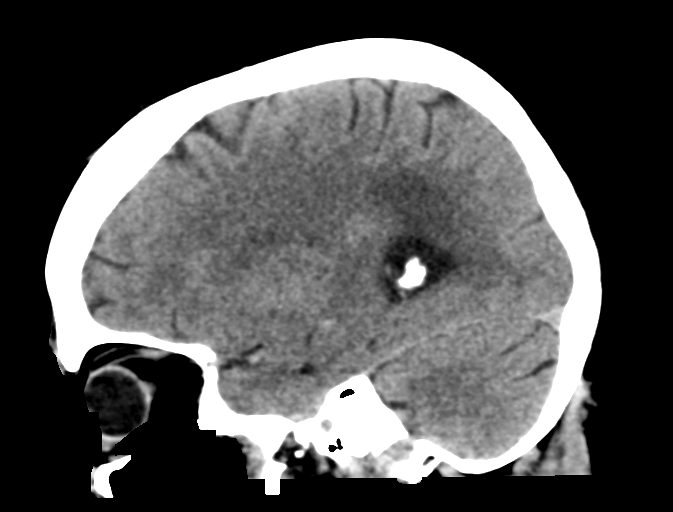
[im 28/55  brain]
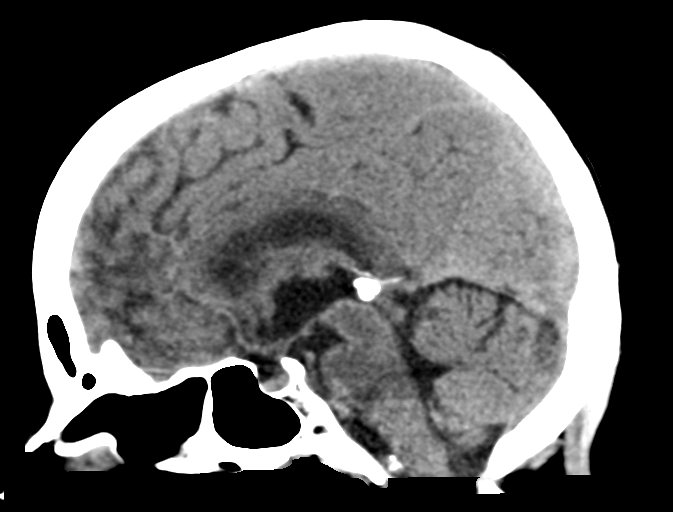
[im 37/55  brain]
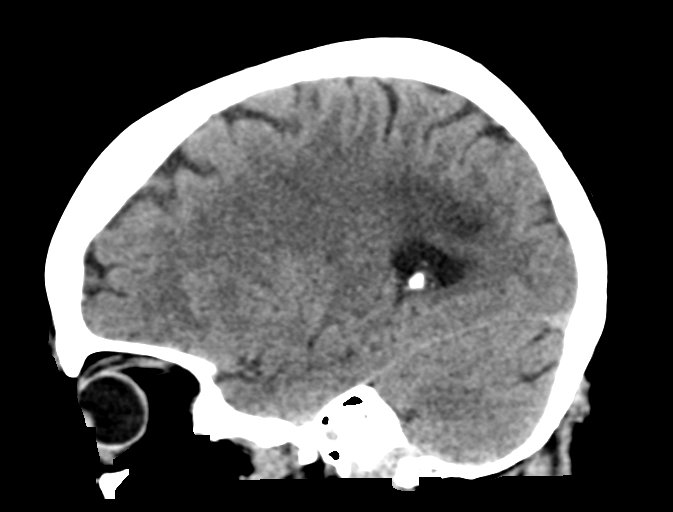

[15 of 47 positions shown; findings below may reference images not displayed]

FINDINGS: Brain: No evidence of acute intracranial hemorrhage or extra-axial
collection.No evidence of mass lesion/concern mass effect.The
ventricles are normal in size.Confluent periventricular and
subcortical white matter hypoattenuation, which is nonspecific but
likely sequela of chronic small vessel ischemic disease, mildly
progressed since the prior exam in [DATE].

Vascular: Vascular calcifications.  No hyperdense vessel.

Skull: Normal. Negative for fracture or focal lesion.

Sinuses/Orbits: Predominantly clear. Mucous retention cyst in a left
posterior ethmoid air cell.

Other: None.
IMPRESSION: No acute intracranial abnormality. Sequela of chronic small vessel
ischemic disease, mildly progressed since the prior exam in [DATE]

## 2021-04-08 IMAGING — MR MR HEAD W/O CM
12 of 13 series · 44 of 48 positions shown · non-contrast
Comparison: [DATE] MRI, correlation is made with same day CT
head

CLINICAL DATA: TIA, memory loss, history of stroke in [REDACTED].

EXAM:
MRI HEAD WITHOUT CONTRAST
TECHNIQUE: Multiplanar, multiecho pulse sequences of the brain and surrounding
structures were obtained without intravenous contrast.

[Series 5: DWI · axial · 3.0mm · 0.88mm/px · z∈[-88,+59]mm · 8 of 100 slices shown (1 of 4)]
[im 1/100]
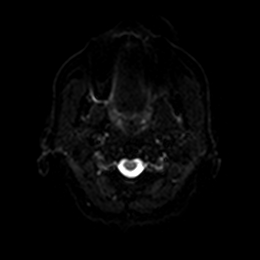
[im 15/100]
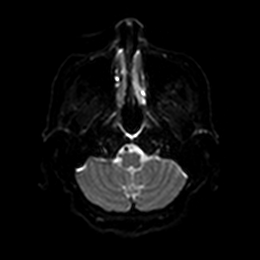
[im 29/100]
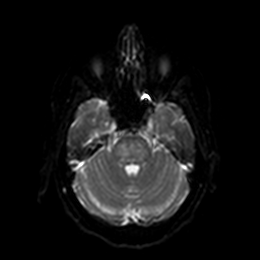
[im 43/100]
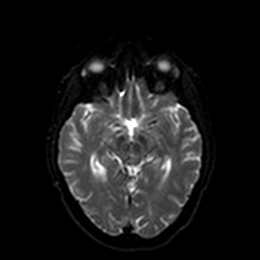
[im 57/100]
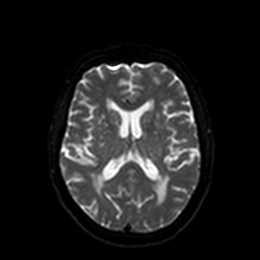
[im 71/100]
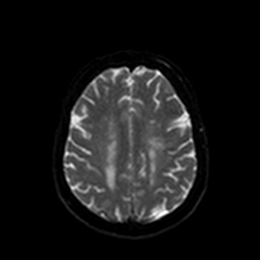
[im 85/100]
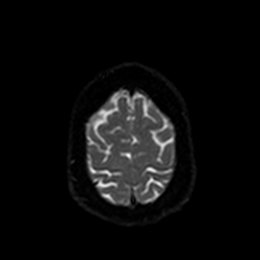
[im 100/100]
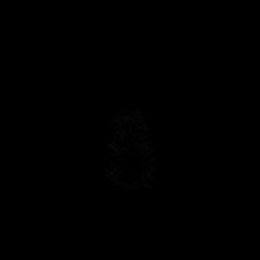

[Series 6: DWI · axial · 3.0mm · 0.88mm/px · z∈[-88,+59]mm · 4 of 50 slices shown (2 of 4)]
[im 1/50]
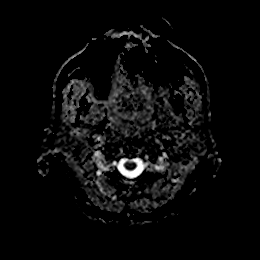
[im 17/50]
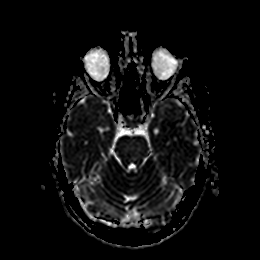
[im 33/50]
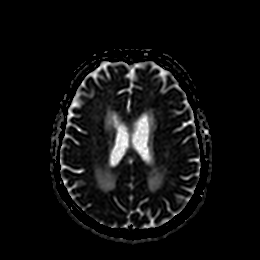
[im 50/50]
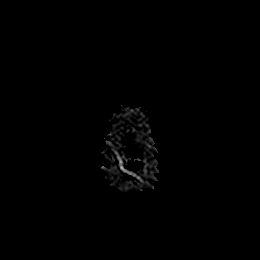

[Series 7: DWI · coronal · 4.0mm · 0.88mm/px · 5 of 70 slices shown (3 of 4)]
[im 1/70]
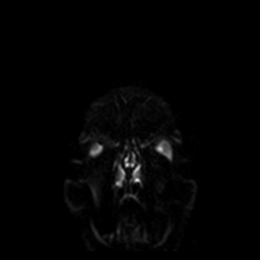
[im 18/70]
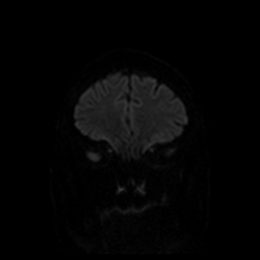
[im 35/70]
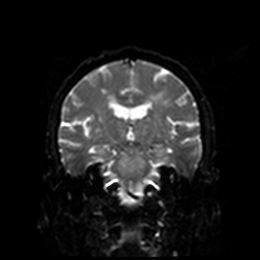
[im 52/70]
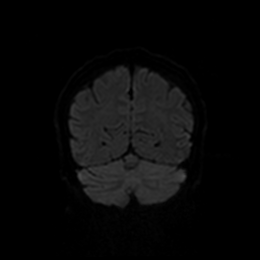
[im 70/70]
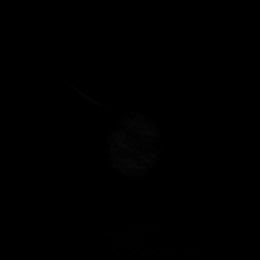

[Series 8: DWI · coronal · 4.0mm · 0.88mm/px · 3 of 35 slices shown (4 of 4)]
[im 1/35]
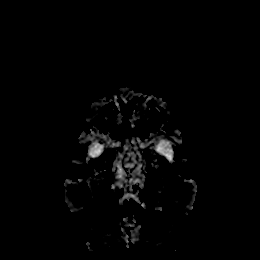
[im 18/35]
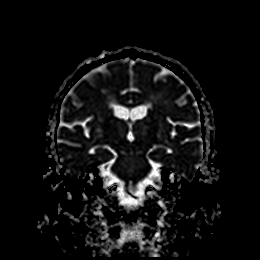
[im 35/35]
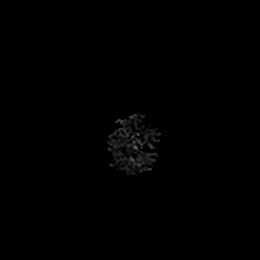

[Series 9: T1 · sagittal · 5.0mm · 0.75mm/px · 2 of 23 slices shown]
[im 1/23]
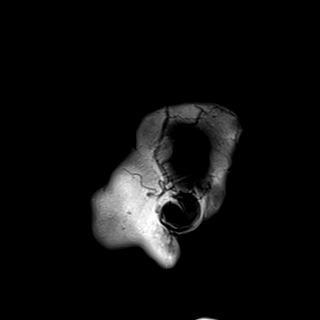
[im 23/23]
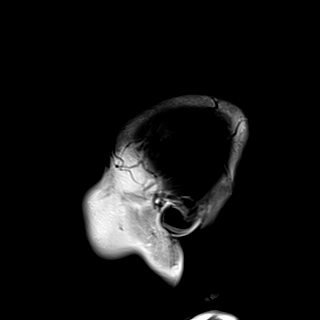

[Series 10: T2 · axial · 5.0mm · 0.72mm/px · z∈[-93,+63]mm · 2 of 27 slices shown (1 of 2)]
[im 1/27]
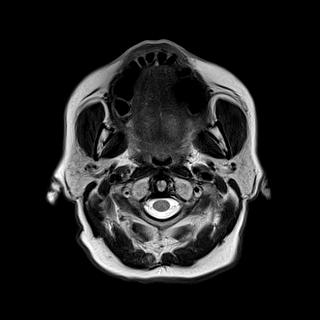
[im 27/27]
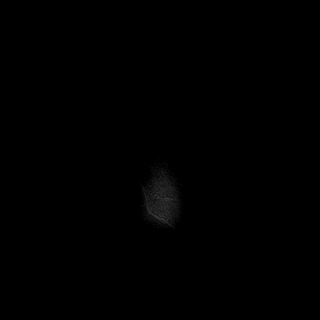

[Series 11: FLAIR · axial · 5.0mm · 0.45mm/px · z∈[-93,+63]mm · 2 of 27 slices shown]
[im 1/27]
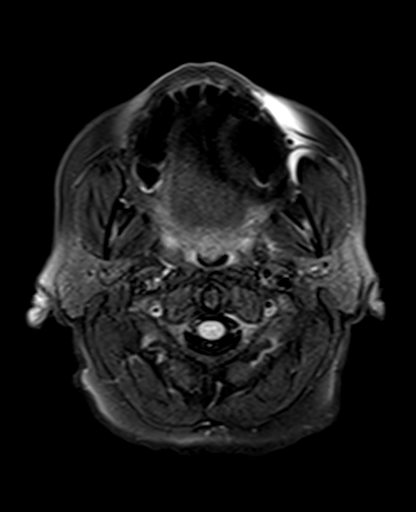
[im 27/27]
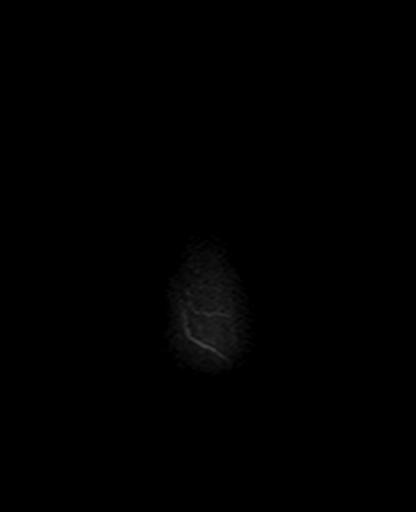

[Series 12: mag_images · axial · 3.0mm · 0.90mm/px · z∈[-98,+67]mm · 4 of 56 slices shown]
[im 1/56]
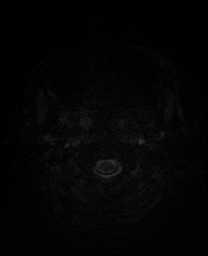
[im 19/56]
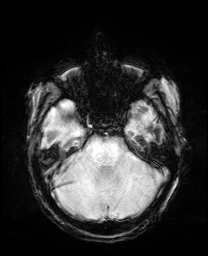
[im 37/56]
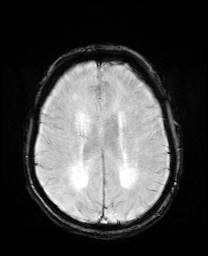
[im 56/56]
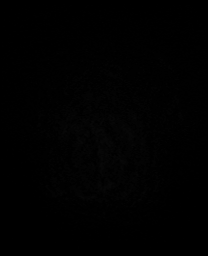

[Series 13: pha_images · axial · 3.0mm · 0.90mm/px · z∈[-98,+58]mm · 4 of 53 slices shown]
[im 1/53]
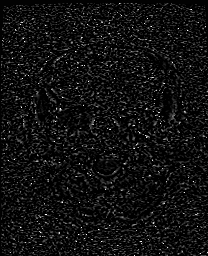
[im 18/53]
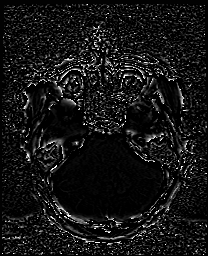
[im 35/53]
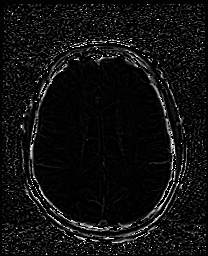
[im 53/53]
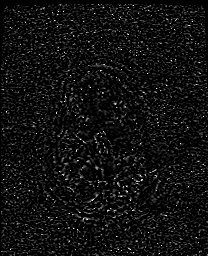

[Series 14: swi_images · axial · 3.0mm · 0.90mm/px · z∈[-98,+67]mm · 4 of 56 slices shown]
[im 1/56]
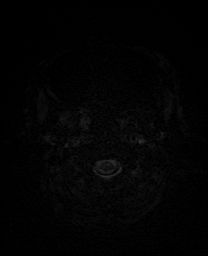
[im 19/56]
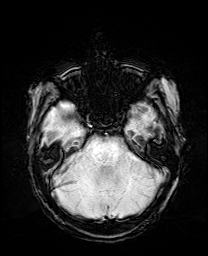
[im 37/56]
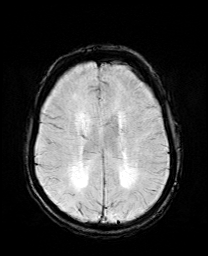
[im 56/56]
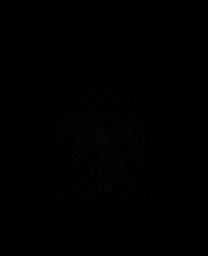

[Series 15: mip_images(sw) · axial · 24.0mm · 0.90mm/px · z∈[-87,+57]mm · 4 of 49 slices shown]
[im 1/49]
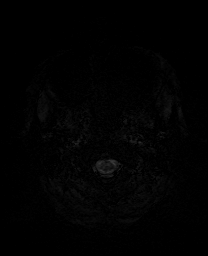
[im 17/49]
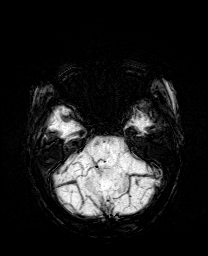
[im 33/49]
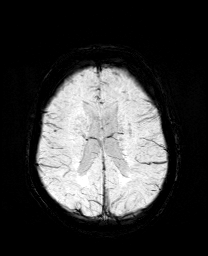
[im 49/49]
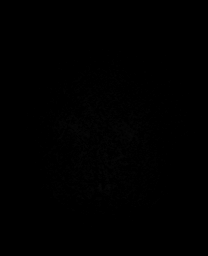

[Series 17: T2 · coronal · 5.0mm · 0.34mm/px · 2 of 29 slices shown (2 of 2)]
[im 1/29]
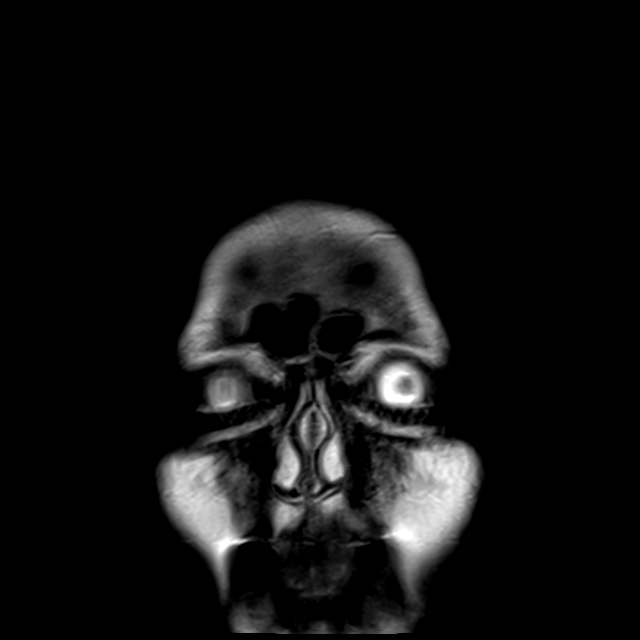
[im 29/29]
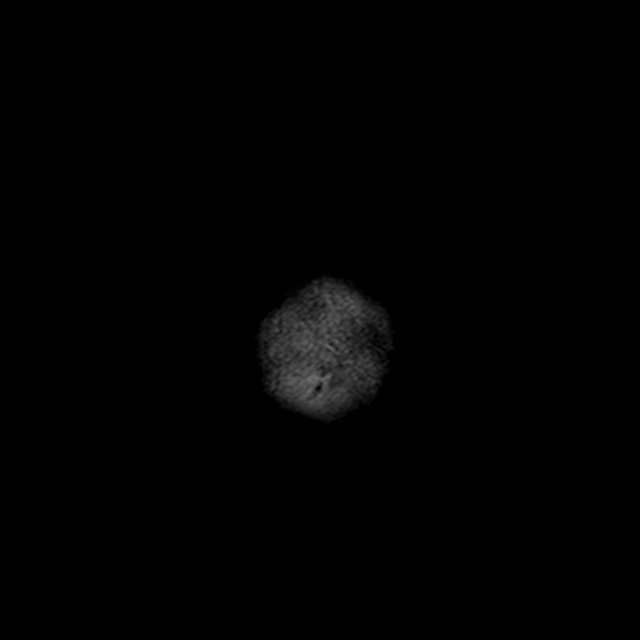

[44 of 48 positions shown; findings below may reference images not displayed]

FINDINGS: Brain: No restricted diffusion to suggest acute or subacute infarct.
No acute hemorrhage, mass, mass effect, or midline shift. No
extra-axial collection or hydrocephalus. Confluent T2 hyperintense
signal in the periventricular white matter and pons, likely the
sequela of moderate to severe chronic small vessel ischemic disease.

Vascular: Normal flow voids.

Skull and upper cervical spine: Normal marrow signal.

Sinuses/Orbits: Mucous retention cysts in the posterior left ethmoid
air cells. Otherwise negative.

Other: Trace fluid in bilateral mastoid air cells.
IMPRESSION: No acute intracranial process.  No evidence of acute infarct.

## 2021-05-24 IMAGING — CT CT CTA ABD/PEL W/CM AND/OR W/O CM
2 of 9 series · 12 of 46 positions shown, 17 images · IV contrast (agent unspecified)
Comparison: [DATE], multiple priors

CLINICAL DATA: Aneurysm, renal or visceral

EXAM:
CTA ABDOMEN AND PELVIS WITHOUT AND WITH CONTRAST
TECHNIQUE: Multidetector CT imaging of the abdomen and pelvis was performed
using the standard protocol during bolus administration of
intravenous contrast. Multiplanar reconstructed images and MIPs were
obtained and reviewed to evaluate the vascular anatomy.

[Series 9: cor · coronal · 0.72mm/px · 2 of 124 slices shown]
[im 42/124  soft-tissue]
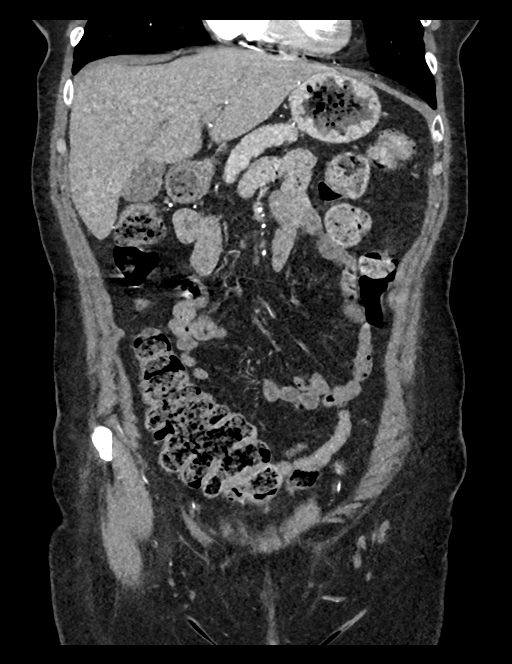
[im 83/124  soft-tissue]
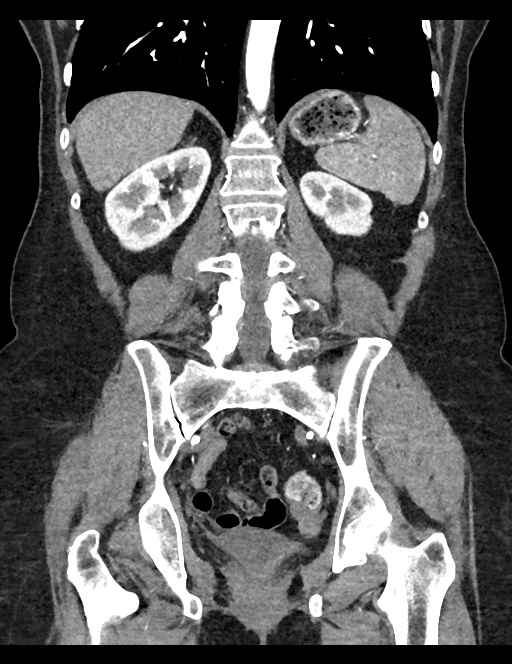

[Series 13: venous · axial · portal-venous · 0.72mm/px · z∈[-468,-88]mm · 10 of 226 slices shown, 15 images]
[im 18/226  soft-tissue]
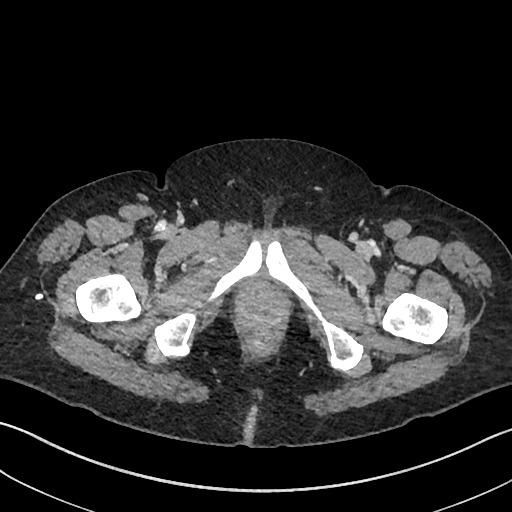
[im 18/226  bone]
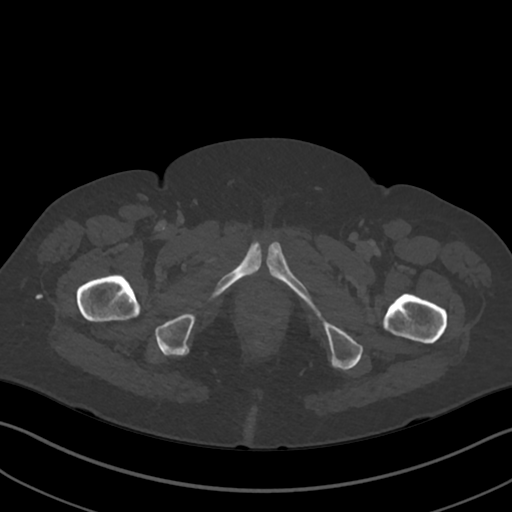
[im 52/226  soft-tissue]
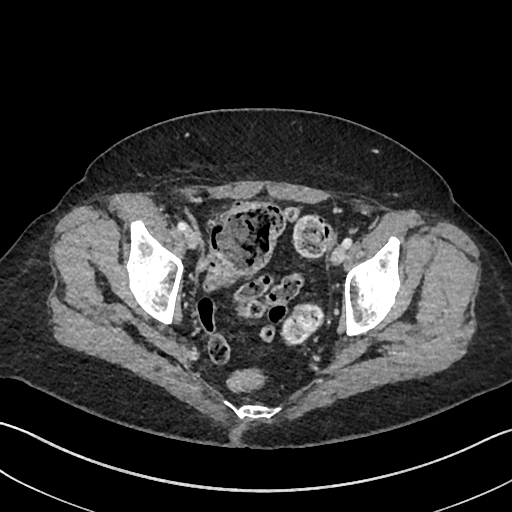
[im 70/226  soft-tissue]
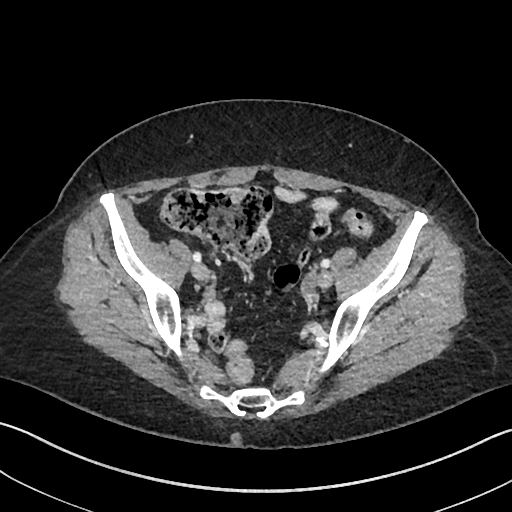
[im 87/226  soft-tissue]
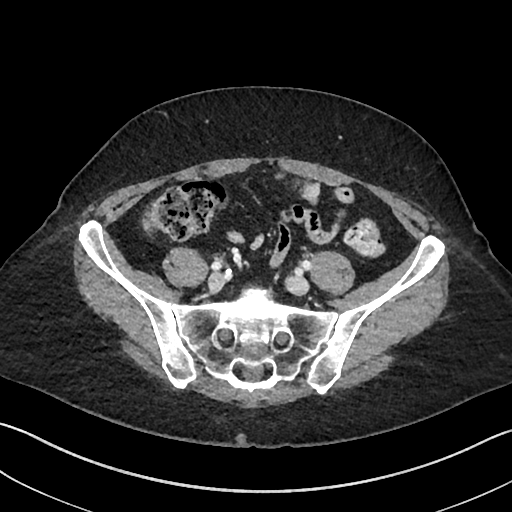
[im 122/226  soft-tissue]
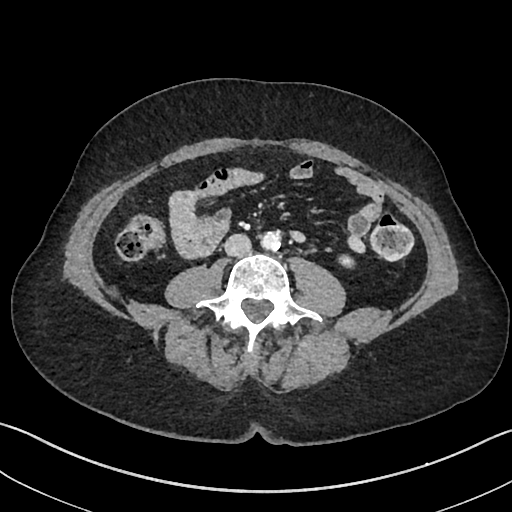
[im 139/226  soft-tissue]
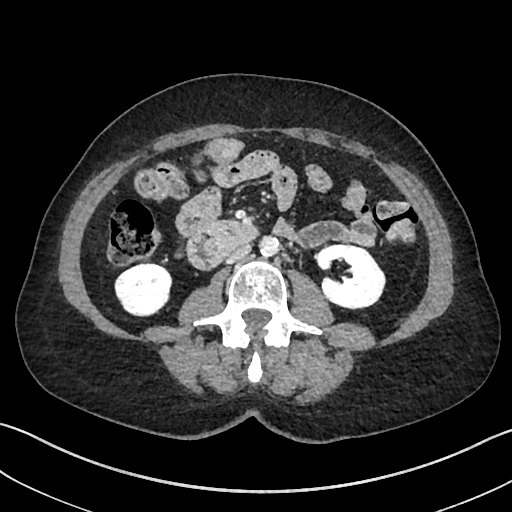
[im 156/226  soft-tissue]
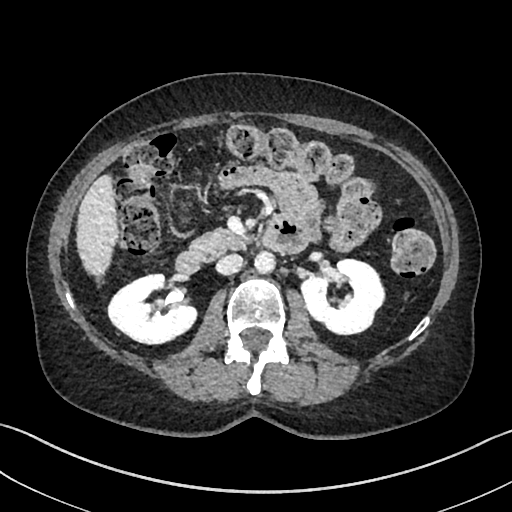
[im 156/226  lung]
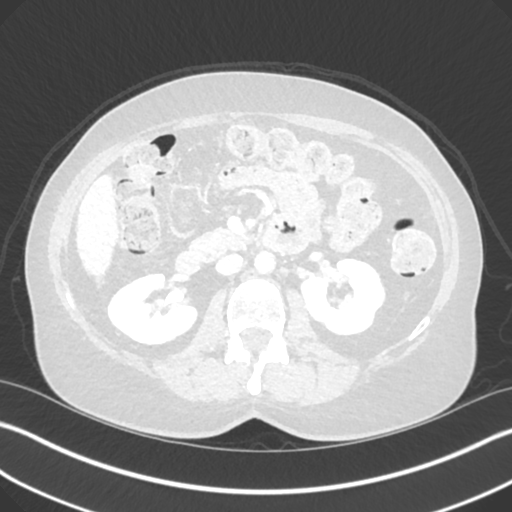
[im 174/226  lung]
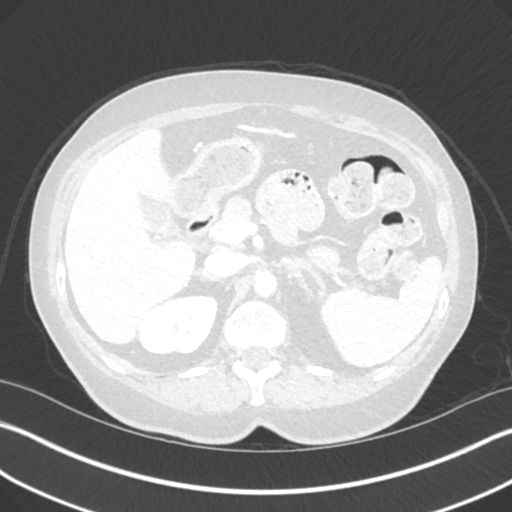
[im 191/226  soft-tissue]
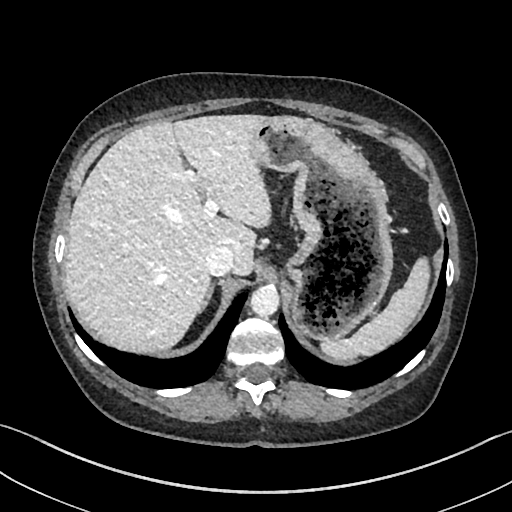
[im 191/226  lung]
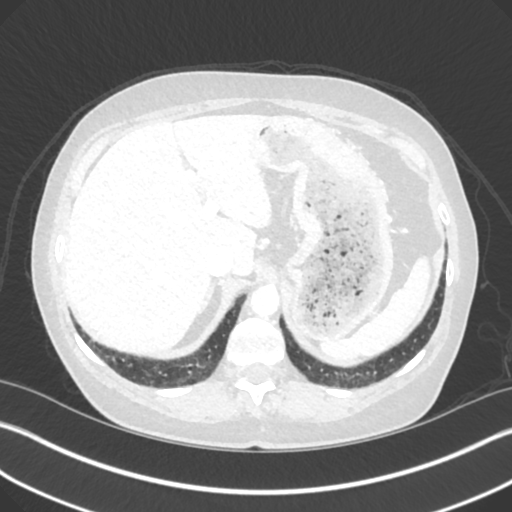
[im 208/226  soft-tissue]
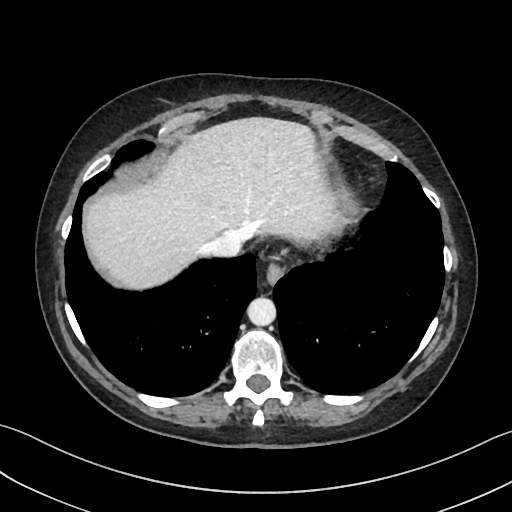
[im 208/226  lung]
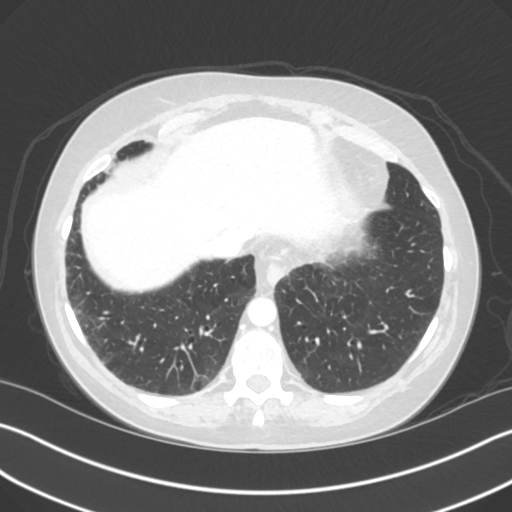
[im 208/226  bone]
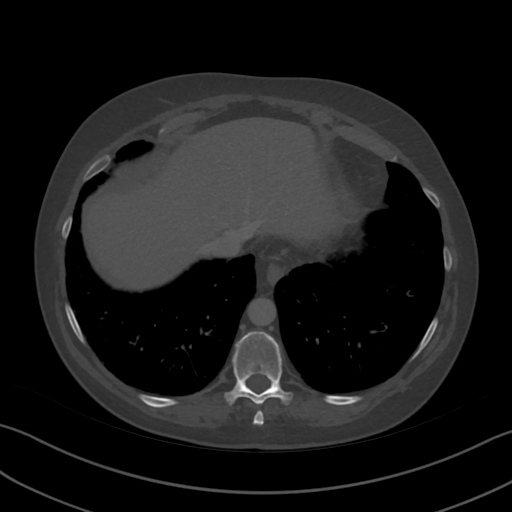

[12 of 46 positions shown; findings below may reference images not displayed]

RADIATION DOSE REDUCTION: This exam was performed according to the
departmental dose-optimization program which includes automated
exposure control, adjustment of the mA and/or kV according to
patient size and/or use of iterative reconstruction technique.

CONTRAST:  100mL OMNIPAQUE IOHEXOL 350 MG/ML SOLN
FINDINGS: VASCULAR

Aorta: Normal caliber aorta without aneurysm, dissection, vasculitis
or significant stenosis. Scattered atherosclerotic calcifications.

Celiac: Patent without evidence of aneurysm, dissection, vasculitis
or significant stenosis. Previously described thrombosed right
hepatic artery pseudoaneurysm is no longer visualized.

SMA: Patent. Again noted is a distal SMA branch saccular aneurysm
versus pseudoaneurysm that measures up to 5.5 mm on today's exam,
previously 6.5 mm.

IMA: Patent.

Renals: Both renal arteries are patent without evidence of aneurysm,
dissection, vasculitis, fibromuscular dysplasia or significant
stenosis. Accessory left renal artery supplies the upper pole. The
main left renal artery is noted to have a lower origin from the
abdominal aorta.

Inflow: Patent without evidence of aneurysm, dissection, vasculitis
or significant stenosis.

Veins: The portal veins and systemic veins are patent.

NON-VASCULAR

Inferior chest: The lung bases are well-aerated.

Hepatobiliary: The liver is normal in size without focal
abnormality. No intrahepatic or extrahepatic biliary ductal
dilation. The gallbladder appears normal.

Spleen: Normal in size without focal abnormality.

Pancreas: No pancreatic ductal dilatation or surrounding
inflammatory changes.

Adrenals/Urinary Tract: Adrenal glands are unremarkable. Kidneys are
normal, without renal calculi, focal lesion, or hydronephrosis.
Bladder is unremarkable.

Stomach/Bowel: The stomach, small bowel and large bowel are normal
in caliber without abnormal wall thickening or surrounding
inflammatory changes.

Reproductive: Status post hysterectomy. No adnexal masses.

Lymphatic: No enlarged lymph nodes in the abdomen or pelvis.

Other: No abdominopelvic ascites.

Musculoskeletal: No aggressive osseous lesions. The soft tissues are
unremarkable.
IMPRESSION: VASCULAR

1. There is a distal SMA branch saccular aneurysm versus
pseudoaneurysm which is similar to slightly decreased in size
measuring 5.5 mm on today's exam, previously 6.5 mm. No new
aneurysmal disease appreciated in the abdomen or pelvis.

NON-VASCULAR

1. No other acute findings in the abdomen or pelvis.

## 2021-06-27 IMAGING — CT CT RENAL STONE PROTOCOL
2 of 4 series · 15 of 46 positions shown, 17 images · non-contrast
Comparison: [DATE]

CLINICAL DATA: Lower abdominal pain and dysuria.



[Series 2: axial st · axial · 0.74mm/px · z∈[-511,-116]mm · 12 of 89 slices shown, 14 images]
[im 5/89  soft-tissue]
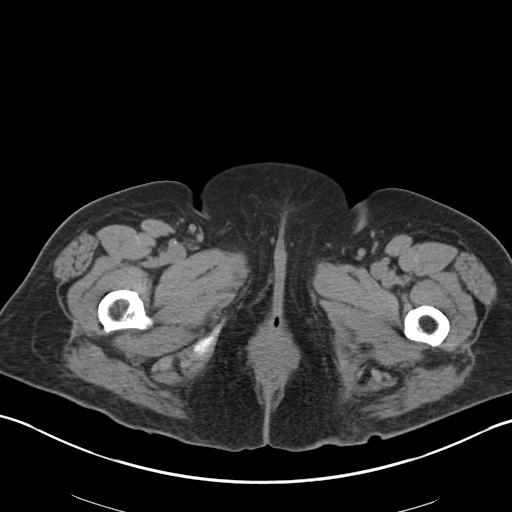
[im 5/89  bone]
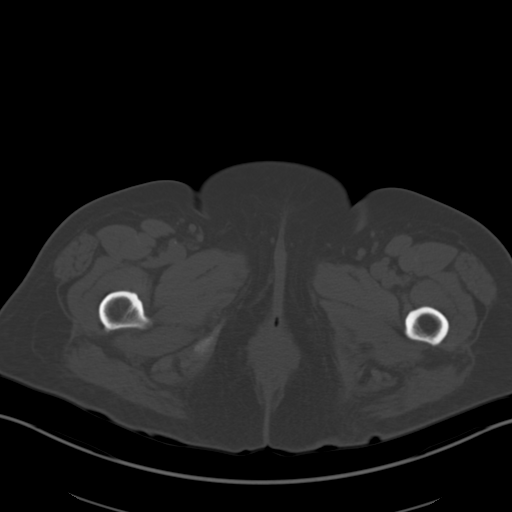
[im 14/89  soft-tissue]
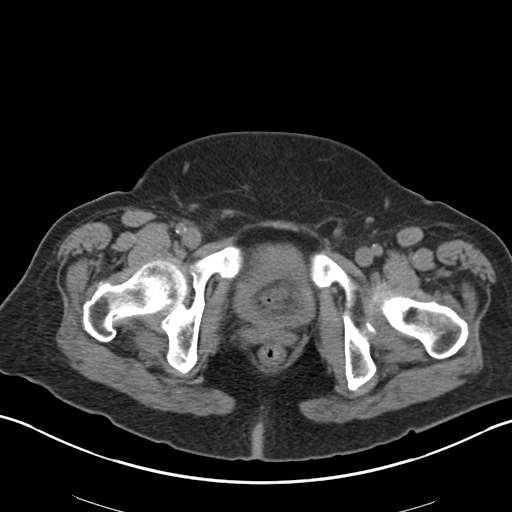
[im 19/89  soft-tissue]
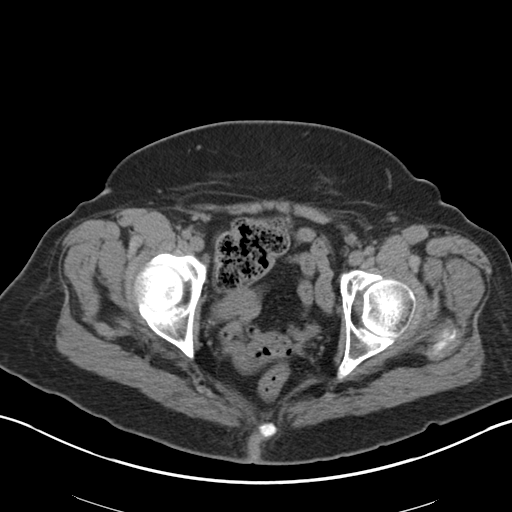
[im 28/89  soft-tissue]
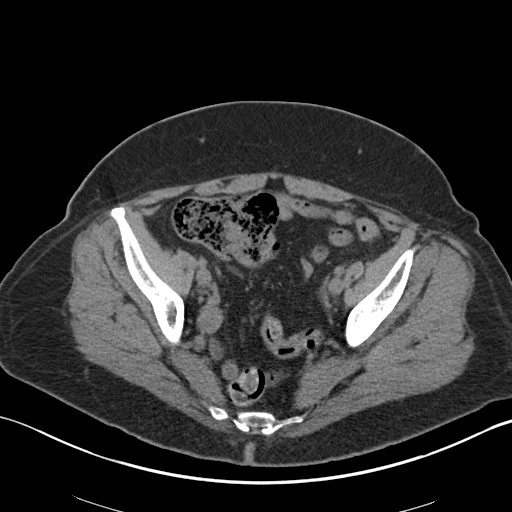
[im 33/89  soft-tissue]
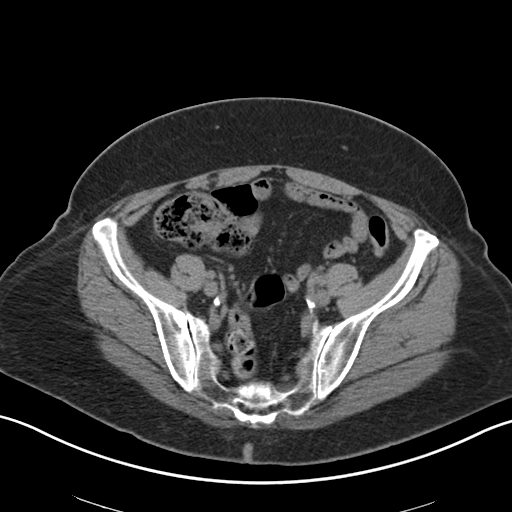
[im 42/89  soft-tissue]
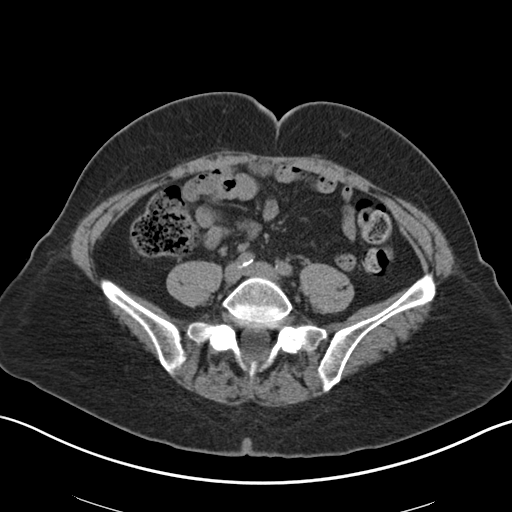
[im 47/89  soft-tissue]
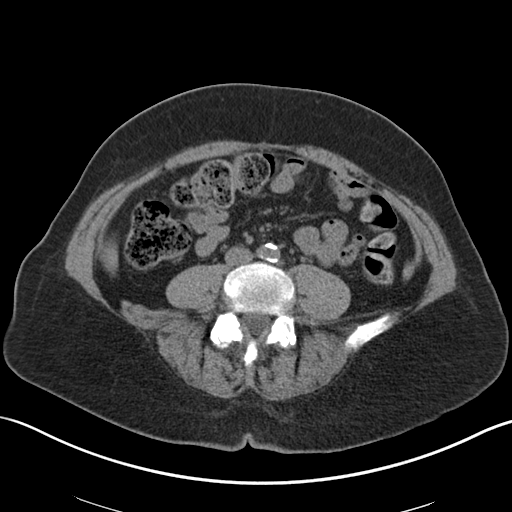
[im 56/89  soft-tissue]
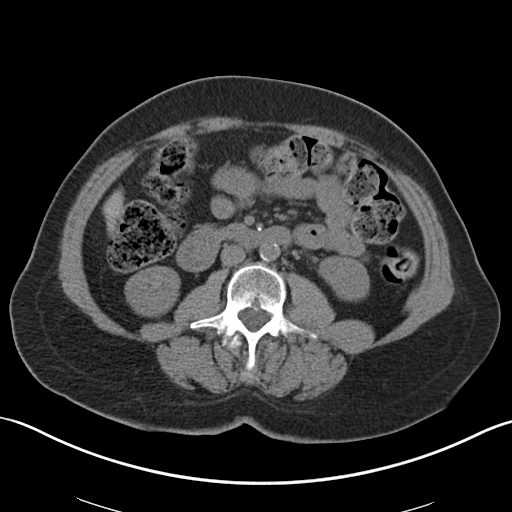
[im 61/89  soft-tissue]
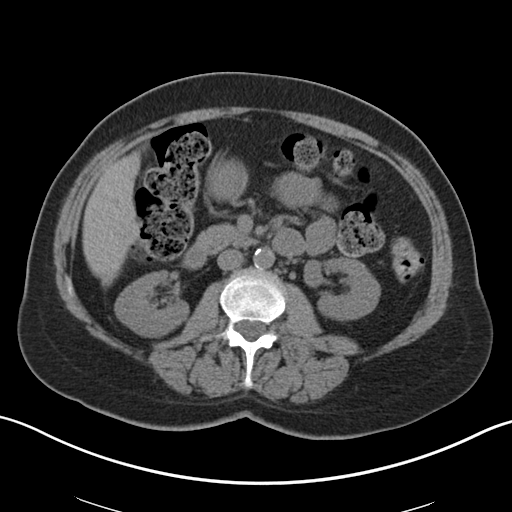
[im 61/89  bone]
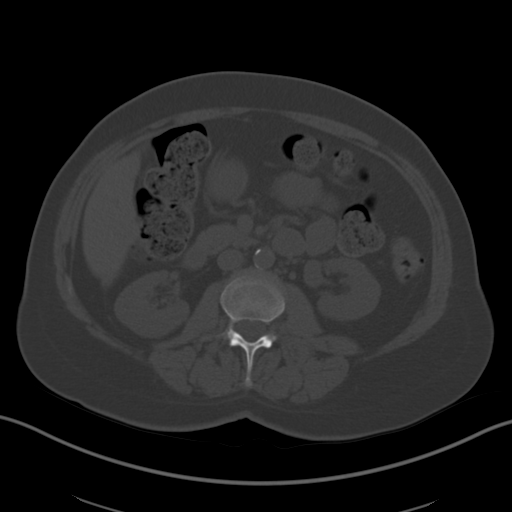
[im 70/89  soft-tissue]
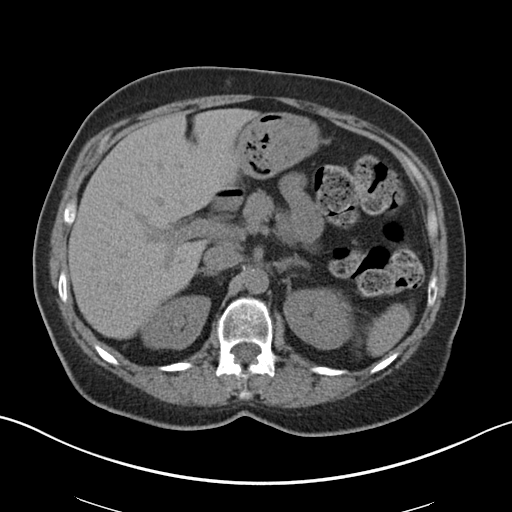
[im 75/89  soft-tissue]
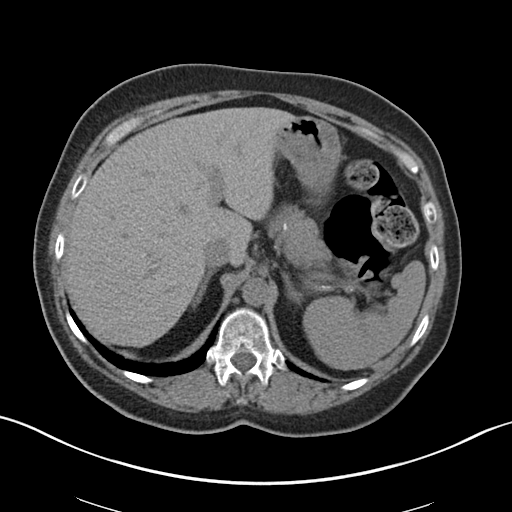
[im 84/89  soft-tissue]
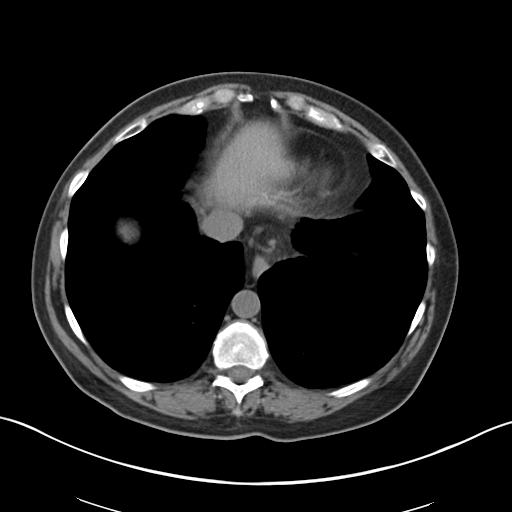

[Series 5: coronal · coronal · 0.66mm/px · 3 of 151 slices shown]
[im 51/151  soft-tissue]
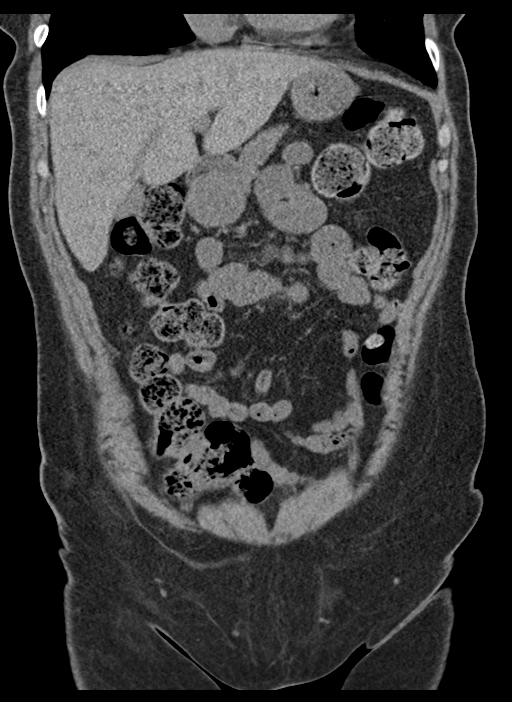
[im 67/151  soft-tissue]
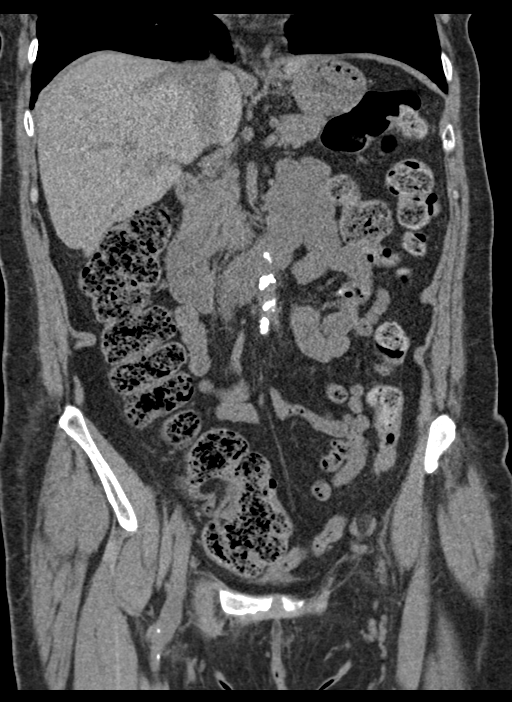
[im 84/151  soft-tissue]
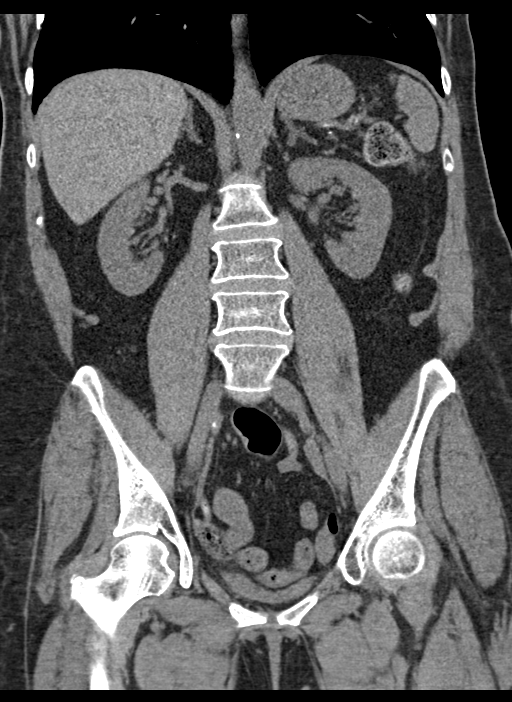

[15 of 46 positions shown; findings below may reference images not displayed]

FINDINGS: Lower chest: The lung bases are clear of acute process. Stable
emphysematous changes and pulmonary scarring changes. Stable right
lower lobe pulmonary nodule on image [DATE] measuring 4 mm. The heart
is normal in size. Age advanced coronary artery calcifications are
noted.

Hepatobiliary: No hepatic lesions or intrahepatic biliary
dilatation. The gallbladder is mildly contracted. No common bile
duct dilatation.

Pancreas: No mass, inflammation or ductal dilatation.

Spleen: Normal size.  No focal lesions.

Adrenals/Urinary Tract: Adrenal glands are unremarkable.

Small bilateral renal calculi are noted. No hydronephrosis but there
is slight prominence of the extrarenal pelvis of the left kidney and
there is a 4 mm calculus at the UPJ. No other ureteral calculi
bilaterally. The bladder is unremarkable.

Stomach/Bowel: The stomach, duodenum, small bowel and colon are
grossly normal without oral contrast. No inflammatory changes, mass
lesions or obstructive findings. The appendix is normal.

Vascular/Lymphatic: Age advanced atherosclerotic calcifications
involving the aorta and iliac arteries but no aneurysm. No
mesenteric or retroperitoneal mass or adenopathy.

Reproductive: The uterus is surgically absent. Both ovaries are
still present and appear normal.

Other: No pelvic mass or adenopathy. No free pelvic fluid
collections. No inguinal mass or adenopathy. No abdominal wall
hernia or subcutaneous lesions.

Musculoskeletal: No significant bony findings.
IMPRESSION: 1. 4 mm calculus at the left UPJ with mild distention of the
extrarenal pelvis no overt hydronephrosis.
2. Small bilateral renal calculi.
3. No acute abdominal/pelvic findings, mass lesions or adenopathy.
4. Age advanced atherosclerotic calcifications involving the aorta
and iliac arteries and coronary arteries.

Aortic Atherosclerosis ([1Q]-[1Q]).
